# Patient Record
Sex: Male | Born: 1980 | Race: White | Hispanic: No | Marital: Married | Smoking: Former smoker
Health system: Southern US, Community
[De-identification: ages and names within clinical notes are randomized; demographics above are authoritative.]

## PROBLEM LIST (undated history)

## (undated) DIAGNOSIS — F329 Major depressive disorder, single episode, unspecified: Secondary | ICD-10-CM

## (undated) DIAGNOSIS — J45909 Unspecified asthma, uncomplicated: Secondary | ICD-10-CM

## (undated) DIAGNOSIS — Z889 Allergy status to unspecified drugs, medicaments and biological substances status: Secondary | ICD-10-CM

## (undated) DIAGNOSIS — F32A Depression, unspecified: Secondary | ICD-10-CM

## (undated) DIAGNOSIS — T7840XA Allergy, unspecified, initial encounter: Secondary | ICD-10-CM

## (undated) HISTORY — DX: Allergy, unspecified, initial encounter: T78.40XA

## (undated) HISTORY — DX: Unspecified asthma, uncomplicated: J45.909

## (undated) HISTORY — PX: OTHER SURGICAL HISTORY: SHX169

## (undated) HISTORY — DX: Depression, unspecified: F32.A

## (undated) HISTORY — DX: Major depressive disorder, single episode, unspecified: F32.9

---

## 2005-07-30 ENCOUNTER — Ambulatory Visit: Payer: Self-pay | Admitting: Internal Medicine

## 2014-07-23 ENCOUNTER — Other Ambulatory Visit: Payer: Self-pay | Admitting: Internal Medicine

## 2014-07-23 DIAGNOSIS — R1310 Dysphagia, unspecified: Secondary | ICD-10-CM

## 2014-07-27 ENCOUNTER — Ambulatory Visit
Admission: RE | Admit: 2014-07-27 | Discharge: 2014-07-27 | Disposition: A | Discharge status: Home / Self Care | Payer: 59 | Source: Ambulatory Visit | Attending: Internal Medicine | Admitting: Internal Medicine

## 2014-07-27 DIAGNOSIS — R1310 Dysphagia, unspecified: Secondary | ICD-10-CM

## 2014-12-12 ENCOUNTER — Other Ambulatory Visit: Payer: Self-pay | Admitting: Gastroenterology

## 2014-12-25 ENCOUNTER — Other Ambulatory Visit: Payer: Self-pay | Admitting: Gastroenterology

## 2014-12-27 ENCOUNTER — Encounter (HOSPITAL_COMMUNITY): Payer: Self-pay | Admitting: *Deleted

## 2014-12-29 NOTE — Anesthesia Preprocedure Evaluation (Addendum)
Anesthesia Evaluation  Patient identified by MRN, date of birth, ID band Patient awake    Reviewed: Allergy & Precautions, NPO status , Patient's Chart, lab work & pertinent test results, reviewed documented beta blocker date and time   Airway Mallampati: II   Neck ROM: Full    Dental  (+) Dental Advisory Given, Teeth Intact   Pulmonary former smoker (quit 07/2014),  breath sounds clear to auscultation        Cardiovascular negative cardio ROS  Rhythm:Regular     Neuro/Psych    GI/Hepatic Neg liver ROS, Stricture esophageal   Endo/Other  negative endocrine ROS  Renal/GU negative Renal ROS     Musculoskeletal   Abdominal (+)  Abdomen: soft.    Peds  Hematology negative hematology ROS (+)   Anesthesia Other Findings   Reproductive/Obstetrics                            Anesthesia Physical Anesthesia Plan  ASA: II  Anesthesia Plan: MAC   Post-op Pain Management:    Induction: Intravenous  Airway Management Planned: Nasal Cannula  Additional Equipment:   Intra-op Plan:   Post-operative Plan:   Informed Consent: I have reviewed the patients History and Physical, chart, labs and discussed the procedure including the risks, benefits and alternatives for the proposed anesthesia with the patient or authorized representative who has indicated his/her understanding and acceptance.     Plan Discussed with:   Anesthesia Plan Comments:         Anesthesia Quick Evaluation

## 2015-01-01 ENCOUNTER — Encounter (HOSPITAL_COMMUNITY)
Admission: RE | Disposition: A | Discharge status: Home / Self Care | Payer: Self-pay | Source: Ambulatory Visit | Attending: Gastroenterology

## 2015-01-01 ENCOUNTER — Ambulatory Visit (HOSPITAL_COMMUNITY): Payer: 59 | Admitting: Anesthesiology

## 2015-01-01 ENCOUNTER — Ambulatory Visit (HOSPITAL_COMMUNITY)
Admission: RE | Admit: 2015-01-01 | Discharge: 2015-01-01 | Disposition: A | Discharge status: Home / Self Care | Payer: 59 | Source: Ambulatory Visit | Attending: Gastroenterology | Admitting: Gastroenterology

## 2015-01-01 ENCOUNTER — Encounter (HOSPITAL_COMMUNITY): Payer: Self-pay | Admitting: Gastroenterology

## 2015-01-01 DIAGNOSIS — R1310 Dysphagia, unspecified: Secondary | ICD-10-CM | POA: Insufficient documentation

## 2015-01-01 DIAGNOSIS — R12 Heartburn: Secondary | ICD-10-CM | POA: Diagnosis present

## 2015-01-01 DIAGNOSIS — Z87891 Personal history of nicotine dependence: Secondary | ICD-10-CM | POA: Diagnosis not present

## 2015-01-01 HISTORY — PX: BALLOON DILATION: SHX5330

## 2015-01-01 HISTORY — DX: Allergy status to unspecified drugs, medicaments and biological substances: Z88.9

## 2015-01-01 HISTORY — PX: ESOPHAGOGASTRODUODENOSCOPY (EGD) WITH PROPOFOL: SHX5813

## 2015-01-01 SURGERY — ESOPHAGOGASTRODUODENOSCOPY (EGD) WITH PROPOFOL
Anesthesia: Monitor Anesthesia Care

## 2015-01-01 MED ORDER — PROPOFOL 10 MG/ML IV BOLUS
INTRAVENOUS | Status: AC
  Filled 2015-01-01: qty 20

## 2015-01-01 MED ORDER — GLYCOPYRROLATE 0.2 MG/ML IJ SOLN
INTRAMUSCULAR | Status: DC | PRN
  Administered 2015-01-01: 0.2 mg via INTRAVENOUS

## 2015-01-01 MED ORDER — MEPERIDINE HCL 100 MG/ML IJ SOLN: 6.2500 mg | INTRAMUSCULAR | Status: DC | PRN

## 2015-01-01 MED ORDER — SODIUM CHLORIDE 0.9 % IV SOLN: INTRAVENOUS | Status: DC

## 2015-01-01 MED ORDER — PROPOFOL 10 MG/ML IV BOLUS
INTRAVENOUS | Status: DC | PRN
  Administered 2015-01-01 (×2): 50 mg via INTRAVENOUS
  Administered 2015-01-01: 100 mg via INTRAVENOUS

## 2015-01-01 MED ORDER — MIDAZOLAM HCL 10 MG/2ML IJ SOLN
INTRAMUSCULAR | Status: AC
  Filled 2015-01-01: qty 2

## 2015-01-01 MED ORDER — LIDOCAINE HCL (PF) 2 % IJ SOLN
INTRAMUSCULAR | Status: DC | PRN
  Administered 2015-01-01: 20 mg via INTRADERMAL

## 2015-01-01 MED ORDER — FENTANYL CITRATE (PF) 100 MCG/2ML IJ SOLN
INTRAMUSCULAR | Status: AC
  Filled 2015-01-01: qty 2

## 2015-01-01 MED ORDER — LACTATED RINGERS IV SOLN
INTRAVENOUS | Status: DC
  Administered 2015-01-01: 1000 mL via INTRAVENOUS

## 2015-01-01 MED ORDER — PROMETHAZINE HCL 25 MG/ML IJ SOLN: 6.2500 mg | INTRAMUSCULAR | Status: DC | PRN

## 2015-01-01 MED ORDER — LIDOCAINE HCL (CARDIAC) 20 MG/ML IV SOLN
INTRAVENOUS | Status: AC
  Filled 2015-01-01: qty 5

## 2015-01-01 SURGICAL SUPPLY — 14 items

## 2015-01-01 NOTE — Op Note (Signed)
Problem: Chronic heartburn with intermittent solid food esophageal dysphagia. 07/27/2014 barium esophagram showed narrowing of the distal esophagus.  Endoscopist: Danise EdgeMartin Johnson  Premedication: Propofol administered by anesthesia  Procedure: Diagnostic esophagogastroduodenoscopy with random esophageal biopsies to rule out eosinophilic esophagitis and esophageal balloon dilation to 15 mm at the esophagogastric junction  The patient was placed in the left lateral decubitus position. The Pentax gastroscope was passed through the posterior hypopharynx into the proximal esophagus without difficulty. The hypopharynx and larynx appeared normal. I did not visualize the vocal cords.  Esophagoscopy: The proximal, mid, and lower segments of the esophageal mucosa appeared completely normal. The squamocolumnar junction was noted at 40 cm from the incisor teeth. There was no endoscopic evidence for the presence of erosive esophagitis, Barrett's esophagus, eosinophilic esophagitis, or esophageal stricture formation. I easily traversed the esophagogastric junction and entered the proximal stomach without resistance but did stimulate a small amount of bleeding due to endoscope trauma.  Gastroscopy: Retroflex view of the gastric cardia and fundus was normal. The gastric body, antrum, and pylorus appeared normal.  Duodenoscopy: The duodenal bulb and descending duodenum appeared normal  Esophageal balloon dilation. With the esophageal balloon located across the esophagogastric junction, the balloon was inflated to 15 mm without apparent esophageal stricture dilation.  Esophageal biopsies: Random esophageal biopsies were performed along the length of the esophagus to look for signs eosinophilic esophagitis  Assessment: Normal esophagogastroduodenoscopy without signs of erosive esophagitis, Barrett's esophagus, esophageal stricture formation, or eosinophilic esophagitis. Random esophageal biopsies were performed to  look for signs of eosinophilic esophagitis. I cannot rule out an esophageal motility disorder resulting in a poorly relaxing lower esophageal sphincter muscle.

## 2015-01-01 NOTE — H&P (Signed)
  Problem: Esophageal dysphagia. 07/27/2014 barium esophagram showed no narrowing of the distal esophagus.  History: The patient is a 34 year old male born [birth year]. He has chronic heartburn and chronic, intermittent solid food dysphagia without odynophagia. If he takes proton pump inhibitor therapy, his heartburn resolves.  He underwent a barium esophagram which showed normal esophageal motility but narrowing at the esophagogastric junction. The barium tablet did not traverse the distal esophageal narrowing and did not enter the stomach.  The patient is scheduled to undergo diagnostic esophagogastroduodenoscopy with possible esophageal stricture dilation.  Medication allergies: Sulfa  Past medical history: Chronic heartburn. Left wrist fracture surgery.  Exam: The patient is alert and lying comfortably on the endoscopy stretcher. Abdomen is soft and nontender to palpation. Lungs are clear to auscultation. Cardiac exam reveals a regular rhythm.  Plan: Proceed with diagnostic esophagogastroduodenoscopy with possible esophageal stricture dilation

## 2015-01-01 NOTE — Transfer of Care (Signed)
Immediate Anesthesia Transfer of Care Note  Patient: Paul Cohen  Procedure(s) Performed: Procedure(s): ESOPHAGOGASTRODUODENOSCOPY (EGD) WITH PROPOFOL (N/A) BALLOON DILATION (N/A)  Patient Location: PACU  Anesthesia Type:MAC  Level of Consciousness:  sedated, patient cooperative and responds to stimulation  Airway & Oxygen Therapy:Patient Spontanous Breathing and Patient connected to face mask oxgen  Post-op Assessment:  Report given to PACU RN and Post -op Vital signs reviewed and stable  Post vital signs:  Reviewed and stable  Last Vitals:  Filed Vitals:   01/01/15 0634  BP: 132/88  Pulse: 78  Temp: 36.5 C  Resp: 14    Complications: No apparent anesthesia complications

## 2015-01-01 NOTE — Discharge Instructions (Signed)
Esophagogastroduodenoscopy °Care After °Refer to this sheet in the next few weeks. These instructions provide you with information on caring for yourself after your procedure. Your caregiver may also give you more specific instructions. Your treatment has been planned according to current medical practices, but problems sometimes occur. Call your caregiver if you have any problems or questions after your procedure.  °HOME CARE INSTRUCTIONS °· Do not eat or drink anything until the numbing medicine (local anesthetic) has worn off and your gag reflex has returned. You will know that the local anesthetic has worn off when you can swallow comfortably. °· Do not drive for 12 hours after the procedure or as directed by your caregiver. °· Only take medicines as directed by your caregiver. °SEEK MEDICAL CARE IF:  °· You cannot stop coughing. °· You are not urinating at all or less than usual. °SEEK IMMEDIATE MEDICAL CARE IF: °· You have difficulty swallowing. °· You cannot eat or drink. °· You have worsening throat or chest pain. °· You have dizziness, lightheadedness, or you faint. °· You have nausea or vomiting. °· You have chills. °· You have a fever. °· You have severe abdominal pain. °· You have black, tarry, or bloody stools. °Document Released: 07/13/2012 Document Reviewed: 07/13/2012 °ExitCare® Patient Information ©2015 [employer]. This information is not intended to replace advice given to you by your health care provider. Make sure you discuss any questions you have with your health care provider. ° °

## 2015-01-01 NOTE — Anesthesia Postprocedure Evaluation (Signed)
  Anesthesia Post-op Note  Patient: Paul HammockMichael L Cohen  Procedure(s) Performed: Procedure(s): ESOPHAGOGASTRODUODENOSCOPY (EGD) WITH PROPOFOL (N/A) BALLOON DILATION (N/A)  Patient Location: PACU  Anesthesia Type:MAC  Level of Consciousness: awake and alert   Airway and Oxygen Therapy: Patient Spontanous Breathing and Patient connected to nasal cannula oxygen  Post-op Pain: none  Post-op Assessment: Post-op Vital signs reviewed, Patient's Cardiovascular Status Stable, Respiratory Function Stable, Patent Airway and No signs of Nausea or vomiting  Post-op Vital Signs: Reviewed and stable  Last Vitals:  Filed Vitals:   01/01/15 0634  BP: 132/88  Pulse: 78  Temp: 36.5 C  Resp: 14    Complications: No apparent anesthesia complications

## 2015-01-02 ENCOUNTER — Encounter (HOSPITAL_COMMUNITY): Payer: Self-pay | Admitting: Gastroenterology

## 2017-04-06 ENCOUNTER — Encounter: Payer: Self-pay | Admitting: Nurse Practitioner

## 2017-04-06 ENCOUNTER — Ambulatory Visit (INDEPENDENT_AMBULATORY_CARE_PROVIDER_SITE_OTHER): Payer: Self-pay | Admitting: Nurse Practitioner

## 2017-04-06 VITALS — BP 121/76 | HR 84 | Temp 98.1°F | Ht 68.0 in | Wt 205.8 lb

## 2017-04-06 DIAGNOSIS — F329 Major depressive disorder, single episode, unspecified: Secondary | ICD-10-CM

## 2017-04-06 DIAGNOSIS — F419 Anxiety disorder, unspecified: Secondary | ICD-10-CM | POA: Insufficient documentation

## 2017-04-06 DIAGNOSIS — F32A Depression, unspecified: Secondary | ICD-10-CM | POA: Insufficient documentation

## 2017-04-06 DIAGNOSIS — Z7689 Persons encountering health services in other specified circumstances: Secondary | ICD-10-CM

## 2017-04-06 DIAGNOSIS — R6882 Decreased libido: Secondary | ICD-10-CM

## 2017-04-06 DIAGNOSIS — B354 Tinea corporis: Secondary | ICD-10-CM

## 2017-04-06 MED ORDER — BUSPIRONE HCL 5 MG PO TABS: 5.0000 mg | ORAL_TABLET | Freq: Two times a day (BID) | ORAL | 2 refills | Status: DC

## 2017-04-06 NOTE — Progress Notes (Signed)
I have reviewed this encounter including the documentation in this note and/or discussed this patient with the provider, Wilhelmina Mcardle, AGPCNP-BC. I am certifying that I agree with the content of this note as supervising physician.  Saralyn Pilar, DO [facility] 04/06/2017, 4:53 PM

## 2017-04-06 NOTE — Assessment & Plan Note (Signed)
Currently uncontrolled off medication.  Denies SI/HI and has no plans to carry out if SI/HI arise. Pt previously treated for depression w/ sertraline and experienced side effect of decreased libido that has not returned to normal after weaning off this medication.  Symptoms affected marriage which is now improved.  Now also having anxiety w/ racing thoughts, rumination, and general nervousness in setting of no significant life stressors. GAD7:17 PHQ9: 15  Plan: 1. Start buspirone 5 mg bid.  Discussed small chance for decreased libido w/ any SSRI, SNRI and buspirone. 2. Discussed stress management strategies. 3. Contact clinic in 2-4 weeks if needing an increase before your next appointment. 4. Follow up 4 weeks.

## 2017-04-06 NOTE — Progress Notes (Signed)
Subjective:    Patient ID: Paul Cohen, male    DOB: 05-01-1981, 36 y.o.   MRN: [id number]  Paul Cohen is a 36 y.o. male presenting on 04/06/2017 for Establish Care (depression) and Rash (bilateral rash on the hands x 2 mths, itching, red and inflammed )   HPI Establish Care New Provider Pt last seen by PCP 1 year ago.  Obtain records from [place] [facility] Dr. Marden Noble.   [facility] Approx. 2006 - "buckshot polyps"  - Pt unsure of what this really was.  Has not had any followup and is slightly concerned about letting it go unmonitored.   Depression and Anxiety Was started on sertraline and was working.  Married in November - decreased libido.  Decided to wean off in February.  Libido increased, but not returned.  Exercising 2 days per week walking and other activity at work. - Lately anxiety and worry and "can't shake it when it comes on."  Racing/ruminating thoughts, difficulty relaxing - life isn't stressful or hard - Has pan-cycle sleep disturbance - not predictable. - some fatigue, some overeating, feels bad/down about self frequently.  Occasional SI w/o plan to carry this out.  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  Uses lotions regularly.   - Notes new rash w/ cracked, scaly skin, also w/ redness, itching on left hand.  Started about 1 week ago.  Has not used any other OTC products.  GAD 7 : Generalized Anxiety Score 04/06/2017  Nervous, Anxious, on Edge 3  Control/stop worrying 3  Worry too much - different things 3  Trouble relaxing 2  Restless 2  Easily annoyed or irritable 2  Afraid - awful might happen 2  Total GAD 7 Score 17  Anxiety Difficulty Very difficult    Depression screen PHQ 2/9 04/06/2017  Decreased Interest 2  Down, Depressed, Hopeless 2  PHQ - 2 Score 4  Altered sleeping 2  Tired, decreased energy 1  Change in appetite 1  Feeling bad or failure about yourself  3    Trouble concentrating 2  Moving slowly or fidgety/restless 1  Suicidal thoughts 1  PHQ-9 Score 15  Difficult doing work/chores Very difficult   Past Medical History:  Diagnosis Date  . Allergy   . Asthma    childhood severe.  Uses 20-30 puffs per year now.  . Depression   . History of seasonal allergies    "Springtime"   Past Surgical History:  Procedure Laterality Date  . BALLOON DILATION N/A 01/01/2015   Procedure: BALLOON DILATION;  Surgeon: Charolett Bumpers, MD;  Location: [facility];  Service: Endoscopy;  Laterality: N/A;  . ESOPHAGOGASTRODUODENOSCOPY (EGD) WITH PROPOFOL N/A 01/01/2015   Procedure: ESOPHAGOGASTRODUODENOSCOPY (EGD) WITH PROPOFOL;  Surgeon: Charolett Bumpers, MD;  Location: [facility];  Service: Endoscopy;  Laterality: N/A; Biopsy negative, esophageal stretch  . left wrist surgery     Social History   Social History  . Marital status: Married    Spouse name: N/A  . Number of children: N/A  . Years of education: N/A   Occupational History  . Not on file.   Social History Main Topics  . Smoking status: Former Smoker    Types: Cigarettes    Quit date: 07/28/2014  . Smokeless tobacco: Former Neurosurgeon    Types: Snuff     Comment: off/ on 10 yrs, Stopped smokeless in January  . Alcohol use Yes  Comment: weekends  . Drug use: No  . Sexual activity: Not on file   Other Topics Concern  . Not on file   Social History Narrative  . No narrative on file   Family History  Problem Relation Age of Onset  . Healthy Mother   . Colon polyps Mother 7       all negative  . Healthy Father   . Healthy Sister   . Bladder Cancer Maternal Grandmother   . Alcohol abuse Maternal Grandfather   . COPD Paternal Grandmother   . Heart attack Paternal Grandfather   . Healthy Sister   . Colon cancer Neg Hx    Current Outpatient Prescriptions on File Prior to Visit  Medication Sig  . albuterol (PROVENTIL HFA;VENTOLIN HFA) 108 (90 BASE) MCG/ACT inhaler Inhale 1  puff into the lungs every 6 (six) hours as needed for wheezing or shortness of breath.  Marland Kitchen ibuprofen (ADVIL,MOTRIN) 200 MG tablet Take 400 mg by mouth every 6 (six) hours as needed for mild pain.   No current facility-administered medications on file prior to visit.     Review of Systems  Constitutional: Negative.   HENT: Negative.   Eyes: Negative.   Respiratory: Negative.   Cardiovascular: Negative.   Gastrointestinal: Negative.   Endocrine: Negative.   Genitourinary:       Decreased libido  Musculoskeletal: Negative.   Skin: Positive for rash.  Allergic/Immunologic: Negative.   Neurological: Negative.   Hematological: Negative.   Psychiatric/Behavioral: Negative.    Per HPI unless specifically indicated above     Objective:    BP 121/76 (BP Location: Right Arm, Patient Position: Sitting, Cuff Size: Normal)   Pulse 84   Temp 98.1 F (36.7 C) (Oral)   Ht 5\' 8"  (1.727 m)   Wt 205 lb 12.8 oz (93.4 kg)   SpO2 99%   BMI 31.29 kg/m   Wt Readings from Last 3 Encounters:  04/06/17 205 lb 12.8 oz (93.4 kg)  01/01/15 200 lb (90.7 kg)    Physical Exam  General - overweight, well-appearing, NAD HEENT - Normocephalic, atraumatic Heart - RRR, no murmurs heard Lungs - Clear throughout all lobes, no wheezing, crackles, or rhonchi. Normal work of breathing. Extremeties - non-tender, no edema, cap refill < 2 seconds, peripheral pulses intact +2 bilaterally Skin - warm, dry - at MCP joint of third digit left hand erythematous and pruritic papule (single lesion). DIP joint second finger right hand, PIP joint fifth finger right hand - cracked and scaly skin Neuro - awake, alert, oriented x3, normal gait, no tremor Psych - Normal mood and affect, normal behavior   No results found for this or any previous visit.    Assessment & Plan:   Problem List Items Addressed This Visit      Other   Anxiety and depression - Primary    Currently uncontrolled off medication.  Denies SI/HI  and has no plans to carry out if SI/HI arise. Pt previously treated for depression w/ sertraline and experienced side effect of decreased libido that has not returned to normal after weaning off this medication.  Symptoms affected marriage which is now improved.  Now also having anxiety w/ racing thoughts, rumination, and general nervousness in setting of no significant life stressors. GAD7:17 PHQ9: 15  Plan: 1. Start buspirone 5 mg bid.  Discussed small chance for decreased libido w/ any SSRI, SNRI and buspirone. 2. Discussed stress management strategies. 3. Contact clinic in 2-4 weeks if needing an increase  before your next appointment. 4. Follow up 4 weeks.      Relevant Medications   busPIRone (BUSPAR) 5 MG tablet    Other Visit Diagnoses    Tinea corporis     Stable and not worsening.  Complicated by poor skin integrity of hands w/ job.  Plan: 1. Encouraged prevention of non-intact skin.  Use ointments, lotions, when has non-intact skin. 2. Use clotrimazole OTC topical application bid x 7-10 days. 3. For pruritis, may use topical hydrocortisone cream. 4. Follow up as needed.    Decreased libido       Started when taking sertraline.  Incomplete resolution off medication.  Plan: 1. Manage depression and anxiety. 2. Consider urology referral or PDE5i if persists. 3. Follow up 4 weeks w/ depression and anxiety.    Encounter to establish care     Previous PCP was at [facility] in [place].  Past medical, family, and surgical history reviewed.  Records request for PCP and GI made.       Meds ordered this encounter  Medications  . busPIRone (BUSPAR) 5 MG tablet    Sig: Take 1 tablet (5 mg total) by mouth 2 (two) times daily.    Dispense:  60 tablet    Refill:  2    Order Specific Question:   Supervising Provider    Answer:   Smitty Cords [[id number]]      Follow up plan: Return in about 4 weeks (around 05/04/2017) for anxiety and depression AND Annual Physical  October w/ labs 2-3 before .  Wilhelmina Mcardle, DNP, AGPCNP-BC Adult Gerontology Primary Care Nurse Practitioner [facility] 04/06/2017, 2:49 PM

## 2017-04-06 NOTE — Patient Instructions (Addendum)
Paul Cohen, Thank you for coming in to clinic today.  1. For your depression and anxiety: - START buspirone 5 mg twice daily. - Contact clinic in 2-4 weeks if needing an increase before your next appointment.  2. You are due for an annual physical You will be due for FASTING BLOOD WORK (no food or drink after midnight before, only water or coffee without cream/sugar on the morning of) - Please go ahead and schedule a "Lab Only" visit in the morning at the clinic for lab draw in  2-3 days before next Annual Physical.  For Lab Results, once available within 2-3 days of blood draw, you can can log in to MyChart online to view your results and a brief explanation. Also, we can discuss results at next follow-up visit.  3. For your rash: Likely fungal infection. Use OTC Lotrimin or generic clotrimazole topically 2x daily for 7-10 days.   - Hydrocortisone cream for itching  - Res-Q ointment - prevent cracking and easy healing. - Good strong ointment in winter.   Please schedule a follow-up appointment with Wilhelmina Mcardle, AGNP. Return in about 4 weeks (around 05/04/2017) for anxiety and depression AND Annual Physical October w/ labs 2-3 before .  If you have any other questions or concerns, please feel free to call the clinic or send a message through MyChart. You may also schedule an earlier appointment if necessary.  You will receive a survey after today's visit either digitally by e-mail or paper by [employer]. Your experiences and feedback matter to [place].  Please respond so we know how we are doing as we provide care for you.   Wilhelmina Mcardle, DNP, AGNP-BC Adult Gerontology Nurse Practitioner [facility], [facility]    Living With Anxiety After being diagnosed with an anxiety disorder, you may be relieved to know why you have felt or behaved a certain way. It is natural to also feel overwhelmed about the treatment ahead and what it will mean for your life. With care and support,  you can manage this condition and recover from it. How to cope with anxiety Dealing with stress Stress is your body's reaction to life changes and events, both good and bad. Stress can last just a few hours or it can be ongoing. Stress can play a major role in anxiety, so it is important to learn both how to cope with stress and how to think about it differently. Talk with your health care provider or a counselor to learn more about stress reduction. He or she may suggest some stress reduction techniques, such as:  Music therapy. This can include creating or listening to music that you enjoy and that inspires you.  Mindfulness-based meditation. This involves being aware of your normal breaths, rather than trying to control your breathing. It can be done while sitting or walking.  Centering prayer. This is a kind of meditation that involves focusing on a word, phrase, or sacred image that is meaningful to you and that brings you peace.  Deep breathing. To do this, expand your stomach and inhale slowly through your nose. Hold your breath for 3-5 seconds. Then exhale slowly, allowing your stomach muscles to relax.  Self-talk. This is a skill where you identify thought patterns that lead to anxiety reactions and correct those thoughts.  Muscle relaxation. This involves tensing muscles then relaxing them.  Choose a stress reduction technique that fits your lifestyle and personality. Stress reduction techniques take time and practice. Set aside 5-15 minutes  a day to do them. Therapists can offer training in these techniques. The training may be covered by some insurance plans. Other things you can do to manage stress include:  Keeping a stress diary. This can help you learn what triggers your stress and ways to control your response.  Thinking about how you respond to certain situations. You may not be able to control everything, but you can control your reaction.  Making time for activities that  help you relax, and not feeling guilty about spending your time in this way.  Therapy combined with coping and stress-reduction skills provides the best chance for successful treatment. Medicines Medicines can help ease symptoms. Medicines for anxiety include:  Anti-anxiety drugs.  Antidepressants.  Beta-blockers.  Medicines may be used as the main treatment for anxiety disorder, along with therapy, or if other treatments are not working. Medicines should be prescribed by a health care provider. Relationships Relationships can play a big part in helping you recover. Try to spend more time connecting with trusted friends and family members. Consider going to couples counseling, taking family education classes, or going to family therapy. Therapy can help you and others better understand the condition. How to recognize changes in your condition Everyone has a different response to treatment for anxiety. Recovery from anxiety happens when symptoms decrease and stop interfering with your daily activities at home or work. This may mean that you will start to:  Have better concentration and focus.  Sleep better.  Be less irritable.  Have more energy.  Have improved memory.  It is important to recognize when your condition is getting worse. Contact your health care provider if your symptoms interfere with home or work and you do not feel like your condition is improving. Where to find help and support: You can get help and support from these sources:  Self-help groups.  Online and [employer].  A trusted spiritual leader.  Couples counseling.  Family education classes.  Family therapy.  Follow these instructions at home:  Eat a healthy diet that includes plenty of vegetables, fruits, whole grains, low-fat dairy products, and lean protein. Do not eat a lot of foods that are high in solid fats, added sugars, or salt.  Exercise. Most adults should do the  following: ? Exercise for at least 150 minutes each week. The exercise should increase your heart rate and make you sweat (moderate-intensity exercise). ? Strengthening exercises at least twice a week.  Cut down on caffeine, tobacco, alcohol, and other potentially harmful substances.  Get the right amount and quality of sleep. Most adults need 7-9 hours of sleep each night.  Make choices that simplify your life.  Take over-the-counter and prescription medicines only as told by your health care provider.  Avoid caffeine, alcohol, and certain over-the-counter cold medicines. These may make you feel worse. Ask your pharmacist which medicines to avoid.  Keep all follow-up visits as told by your health care provider. This is important. Questions to ask your health care provider  Would I benefit from therapy?  How often should I follow up with a health care provider?  How long do I need to take medicine?  Are there any long-term side effects of my medicine?  Are there any alternatives to taking medicine? Contact a health care provider if:  You have a hard time staying focused or finishing daily tasks.  You spend many hours a day feeling worried about everyday life.  You become exhausted by worry.  You start  to have headaches, feel tense, or have nausea.  You urinate more than normal.  You have diarrhea. Get help right away if:  You have a racing heart and shortness of breath.  You have thoughts of hurting yourself or others. If you ever feel like you may hurt yourself or others, or have thoughts about taking your own life, get help right away. You can go to your nearest emergency department or call:  Your local emergency services (911 in the [place]  A suicide crisis helpline, such as the National Suicide Prevention Lifeline at 210-436-1334. This is open 24-hours a day.  Summary  Taking steps to deal with stress can help calm you.  Medicines cannot cure anxiety  disorders, but they can help ease symptoms.  Family, friends, and partners can play a big part in helping you recover from an anxiety disorder. This information is not intended to replace advice given to you by your health care provider. Make sure you discuss any questions you have with your health care provider. Document Released: 07/21/2016 Document Revised: 07/21/2016 Document Reviewed: 07/21/2016 Elsevier Interactive Patient Education  [employer].

## 2017-04-19 ENCOUNTER — Encounter: Payer: Self-pay | Admitting: Nurse Practitioner

## 2017-04-19 DIAGNOSIS — F329 Major depressive disorder, single episode, unspecified: Secondary | ICD-10-CM

## 2017-04-19 DIAGNOSIS — F32A Depression, unspecified: Secondary | ICD-10-CM

## 2017-04-19 DIAGNOSIS — F419 Anxiety disorder, unspecified: Principal | ICD-10-CM

## 2017-04-21 MED ORDER — BUSPIRONE HCL 7.5 MG PO TABS: 7.5000 mg | ORAL_TABLET | Freq: Two times a day (BID) | ORAL | 2 refills | Status: DC

## 2017-05-04 ENCOUNTER — Encounter: Payer: Self-pay | Admitting: Nurse Practitioner

## 2017-05-04 ENCOUNTER — Ambulatory Visit (INDEPENDENT_AMBULATORY_CARE_PROVIDER_SITE_OTHER): Payer: Self-pay | Admitting: Nurse Practitioner

## 2017-05-04 VITALS — BP 121/66 | HR 76 | Temp 97.7°F | Ht 68.0 in | Wt 204.6 lb

## 2017-05-04 DIAGNOSIS — L309 Dermatitis, unspecified: Secondary | ICD-10-CM

## 2017-05-04 DIAGNOSIS — F419 Anxiety disorder, unspecified: Secondary | ICD-10-CM

## 2017-05-04 DIAGNOSIS — F329 Major depressive disorder, single episode, unspecified: Secondary | ICD-10-CM

## 2017-05-04 DIAGNOSIS — F32A Depression, unspecified: Secondary | ICD-10-CM

## 2017-05-04 MED ORDER — BUPROPION HCL ER (SR) 100 MG PO TB12: 100.0000 mg | ORAL_TABLET | Freq: Two times a day (BID) | ORAL | 1 refills | Status: DC

## 2017-05-04 MED ORDER — BUSPIRONE HCL 7.5 MG PO TABS: 7.5000 mg | ORAL_TABLET | Freq: Two times a day (BID) | ORAL | 2 refills | Status: DC

## 2017-05-04 MED ORDER — TRIAMCINOLONE ACETONIDE 0.025 % EX LOTN: 1.0000 "application " | TOPICAL_LOTION | Freq: Two times a day (BID) | CUTANEOUS | 2 refills | Status: DC

## 2017-05-04 NOTE — Progress Notes (Signed)
Subjective:    Patient ID: Paul Cohen, male    DOB: 18-Oct-1980, 36 y.o.   MRN: [id number]  Paul Cohen is a 36 y.o. male presenting on 05/04/2017 for Anxiety (depression)   HPI  Anxiety and Depression Libido improving.  Anxiety improved.  Depression still predominant. - Sertraline - significant libido decrease. - Improving sleep quality w/ onset of and maintenance of sleep.   - Appetite swings (over and under eating). - Still exercising only 2 days per week. 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  - Has been using topical antifungal cream w/o relief.  Also keeps regular ointments on hands (lotion, vaseline).   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   Hemorrhoid/Tear After BM, in water had a couple drops blood. Lasted about 2 days 2 weeks ago.  Then repeat yesterday.  Bright red blood only occurs after BM.  Has not noticed any blood on or in his stool.   Depression screen [facility] 2/9 05/04/2017 04/06/2017  Decreased Interest 2 2  Down, Depressed, Hopeless 2 2  PHQ - 2 Score 4 4  Altered sleeping 1 2  Tired, decreased energy 1 1  Change in appetite 1 1  Feeling bad or failure about yourself  2 3  Trouble concentrating 1 2  Moving slowly or fidgety/restless 1 1  Suicidal thoughts 1 1  PHQ-9 Score 12 15  Difficult doing work/chores Somewhat difficult Very difficult   GAD 7 : Generalized Anxiety Score 04/06/2017  Nervous, Anxious, on Edge 3  Control/stop worrying 3  Worry too much - different things 3  Trouble relaxing 2  Restless 2  Easily annoyed or irritable 2  Afraid - awful might happen 2  Total GAD 7 Score 17  Anxiety Difficulty Very difficult      Social History  Substance Use Topics  . Smoking status: Former Smoker    Types: Cigarettes    Quit date: 07/28/2014  .  Smokeless tobacco: Former Neurosurgeon    Types: Snuff     Comment: off/ on 10 yrs, Stopped smokeless in January  . Alcohol use Yes     Comment: weekends    Review of Systems Per HPI unless specifically indicated above     Objective:    BP 121/66 (BP Location: Right Arm, Patient Position: Sitting, Cuff Size: Normal)   Pulse 76   Temp 97.7 F (36.5 C) (Oral)   Ht  (1.727 m)   Wt 204 lb 9.6 oz (92.8 kg)   BMI 31.11 kg/m   Wt Readings from Last 3 Encounters:  05/04/17 204 lb 9.6 oz (92.8 kg)  04/06/17 205 lb 12.8 oz (93.4 kg)  01/01/15 200 lb (90.7 kg)    Physical Exam  General - overweight, well-appearing, NAD HEENT - Normocephalic, atraumatic Heart - RRR, no murmurs heard Lungs - Clear throughout all lobes, no wheezing, crackles, or rhonchi. Normal work of breathing. GU - pt deferred despite complaint of bleeding and possible hemorrhoid/rectal tear Extremeties - non-tender, no edema, cap refill < 2 seconds, peripheral pulses intact +2 bilaterally Skin - warm, dry, erythematous plaques w/ flakiness over MCP joints, and at several DIP/PIP joints bilateral hands. Neuro - awake, alert, oriented x3, normal gait Psych - Normal mood and affect, normal behavior    No results  found for this or any previous visit.    Assessment & Plan:   Problem List Items Addressed This Visit      Musculoskeletal and Integument   Eczema of hand    Mild, but persistent eczema lesions on bilateral hands.  Pt w/ occupational exposures to chemicals, metal dusts. Chronic in nature and not relieved w/ OTC ointments/lotions.  Plan: 1. Start treatment w/ triamcinolone ointment.  Consider dermatology referral if needed. 2. Follow up at next appointment in about 3 monts.      Relevant Medications   Triamcinolone Acetonide 0.025 % LOTN     Other   Anxiety and depression - Primary    Currently improving on medications.  Pt only taking buspirone 7.5 bid.  Denies SI/HI and has no plans to carry out if  SI/HI arise.  Improving libido and having less rumination.  Still having persistent appetite swings. GAD7:8 today down from 17 PHQ9: 12 today down from 15  Plan: 1. Continue buspirone 7.5 mg bid.   2. START bupropion SR 100 mg bid.  Cautioned pt about similar drug names. 3. Follow up 3 months and prn 4-6 weeks      Relevant Medications   buPROPion (WELLBUTRIN SR) 100 MG 12 hr tablet   busPIRone (BUSPAR) 7.5 MG tablet      Meds ordered this encounter  Medications  . buPROPion (WELLBUTRIN SR) 100 MG 12 hr tablet    Sig: Take 1 tablet (100 mg total) by mouth 2 (two) times daily.    Dispense:  60 tablet    Refill:  1  . Triamcinolone Acetonide 0.025 % LOTN    Sig: Apply 1 application topically 2 (two) times daily.    Dispense:  1 Bottle    Refill:  2  . busPIRone (BUSPAR) 7.5 MG tablet    Sig: Take 1 tablet (7.5 mg total) by mouth 2 (two) times daily.    Dispense:  60 tablet    Refill:  2      Follow up plan: Return in about 3 months (around 08/03/2017) for anxiety and depression OR in 4-6 weeks if needed .  Wilhelmina Mcardle, DNP, AGPCNP-BC Adult Gerontology Primary Care Nurse Practitioner [facility] 05/08/2017, 9:13 PM

## 2017-05-04 NOTE — Patient Instructions (Addendum)
Cesario, Thank you for coming in to clinic today.  1. Anxiety and Depression: - CONTINUE buspirone at 7.5 mg tablet twice daily - START bupropion SR 100 mg tablet (Wellbutrin SR) Take one tablet once daily for 3 days. Then, increase to 1 tablet twice daily and continue at this dose. Caution names of these medications. They are very similar.   2. Eczema - hands: - Apply triamcinolone lotion twice daily for up to 6 weeks. Then stop for 2 weeks and resume. - If not improved, I will place a referral for dermatology.  3. For your hemorrhoid: - Preparation H ointment when flares - Tucks (witch hazel)pads - apply for 5-10 minutes then remove. Both will help reduce the inflammation.   Please schedule a follow-up appointment with Wilhelmina Mcardle, AGNP. Return in about 3 months (around 08/03/2017) for anxiety and depression OR in 4-6 weeks if needed .  If you have any other questions or concerns, please feel free to call the clinic or send a message through MyChart. You may also schedule an earlier appointment if necessary.  You will receive a survey after today's visit either digitally by e-mail or paper by [employer]. Your experiences and feedback matter to [place].  Please respond so we know how we are doing as we provide care for you.   Wilhelmina Mcardle, DNP, AGNP-BC Adult Gerontology Nurse Practitioner [facility], [facility]   Hand Dermatitis Hand dermatitis is a skin condition that causes small, itchy, raised dots or fluid-filled blisters to form over the palms of the hands. This condition may also be called hand eczema. What are the causes? The cause of this condition is not known. What increases the risk? This condition is more likely to develop in people who have a history of allergies, such as:  Hay fever.  Allergic asthma.  An allergy to latex.  Chemical exposure, injuries, and environmental irritants can make hand dermatitis worse. Washing your hands too often can  remove natural oils, which can dry out the skin and contribute to outbreaks of this condition. What are the signs or symptoms? The most common symptom of this condition is intense itchiness. Cracks or grooves (fissures) on the fingers can also develop. Affected areas can be painful, especially areas where large blisters have formed. How is this diagnosed? This condition is diagnosed with a medical history and physical exam. How is this treated? This condition is treated with medicines, including:  Steroid creams and ointments.  Oral steroid medicines.  Antibiotic medicines. These are prescribed if you have an infection.  Antihistamine medicines. These help to reduce itchiness.  Follow these instructions at home:  Take or apply over-the-counter and prescription medicines only as told by your health care provider.  If you were prescribed an antibiotic medicine, use it as told by your health care provider. Do not stop using the antibiotic even if you start to feel better.  Avoid washing your hands more often than necessary.  Avoid using harsh chemicals on your hands.  Wear protective gloves when you handle products that can irritate your skin.  Keep all follow-up visits as told by your health care provider. This is important. Contact a health care provider if:  Your rash does not improve during the first week of treatment.  Your rash is red or tender.  Your rash has pus coming from it.  Your rash spreads. This information is not intended to replace advice given to you by your health care provider. Make sure you discuss  any questions you have with your health care provider. Document Released: 07/27/2005 Document Revised: 01/02/2016 Document Reviewed: 02/08/2015 Elsevier Interactive Patient Education  [employer].

## 2017-05-08 NOTE — Assessment & Plan Note (Addendum)
Mild, but persistent eczema lesions on bilateral hands.  Pt w/ occupational exposures to chemicals, metal dusts. Chronic in nature and not relieved w/ OTC ointments/lotions.  Plan: 1. Start treatment w/ triamcinolone ointment.  Consider dermatology referral if needed. 2. Follow up at next appointment in about 3 monts.

## 2017-05-08 NOTE — Assessment & Plan Note (Addendum)
Currently improving on medications.  Pt only taking buspirone 7.5 bid.  Denies SI/HI and has no plans to carry out if SI/HI arise.  Improving libido and having less rumination.  Still having persistent appetite swings. GAD7:8 today down from 17 PHQ9: 12 today down from 15  Plan: 1. Continue buspirone 7.5 mg bid.   2. START bupropion SR 100 mg bid.  Cautioned pt about similar drug names. 3. Follow up 3 months and prn 4-6 weeks

## 2017-05-13 ENCOUNTER — Other Ambulatory Visit: Payer: Self-pay

## 2017-05-18 ENCOUNTER — Encounter: Payer: Self-pay | Admitting: Nurse Practitioner

## 2017-06-05 ENCOUNTER — Encounter: Payer: Self-pay | Admitting: Nurse Practitioner

## 2017-06-05 DIAGNOSIS — F329 Major depressive disorder, single episode, unspecified: Secondary | ICD-10-CM

## 2017-06-05 DIAGNOSIS — F419 Anxiety disorder, unspecified: Principal | ICD-10-CM

## 2017-06-05 DIAGNOSIS — F32A Depression, unspecified: Secondary | ICD-10-CM

## 2017-06-07 MED ORDER — BUSPIRONE HCL 5 MG PO TABS: 7.5000 mg | ORAL_TABLET | Freq: Two times a day (BID) | ORAL | 5 refills | Status: DC

## 2017-06-30 ENCOUNTER — Other Ambulatory Visit: Payer: Self-pay | Admitting: Nurse Practitioner

## 2017-06-30 DIAGNOSIS — F419 Anxiety disorder, unspecified: Principal | ICD-10-CM

## 2017-06-30 DIAGNOSIS — F329 Major depressive disorder, single episode, unspecified: Secondary | ICD-10-CM

## 2017-07-14 ENCOUNTER — Other Ambulatory Visit: Payer: Self-pay | Admitting: Nurse Practitioner

## 2017-07-14 ENCOUNTER — Other Ambulatory Visit: Payer: Self-pay

## 2017-07-14 DIAGNOSIS — Z0000 Encounter for general adult medical examination without abnormal findings: Secondary | ICD-10-CM

## 2017-07-15 LAB — CBC WITH DIFFERENTIAL/PLATELET
Basophils Absolute: 63 cells/uL (ref 0–200)
Basophils Relative: 0.9 %
Eosinophils Absolute: 252 cells/uL (ref 15–500)
Eosinophils Relative: 3.6 %
HCT: 43.7 % (ref 38.5–50.0)
Hemoglobin: 15 g/dL (ref 13.2–17.1)
Lymphs Abs: 1323 cells/uL (ref 850–3900)
MCH: 28.1 pg (ref 27.0–33.0)
MCHC: 34.3 g/dL (ref 32.0–36.0)
MCV: 82 fL (ref 80.0–100.0)
MPV: 9.8 fL (ref 7.5–12.5)
Monocytes Relative: 7.7 %
Neutro Abs: 4823 cells/uL (ref 1500–7800)
Neutrophils Relative %: 68.9 %
Platelets: 314 10*3/uL (ref 140–400)
RBC: 5.33 10*6/uL (ref 4.20–5.80)
RDW: 13.1 % (ref 11.0–15.0)
Total Lymphocyte: 18.9 %
WBC mixed population: 539 cells/uL (ref 200–950)
WBC: 7 10*3/uL (ref 3.8–10.8)

## 2017-07-15 LAB — COMPREHENSIVE METABOLIC PANEL
AG Ratio: 1.8 (calc) (ref 1.0–2.5)
ALT: 27 U/L (ref 9–46)
AST: 22 U/L (ref 10–40)
Albumin: 4.4 g/dL (ref 3.6–5.1)
Alkaline phosphatase (APISO): 44 U/L (ref 40–115)
BUN: 15 mg/dL (ref 7–25)
CO2: 30 mmol/L (ref 20–32)
Calcium: 9.4 mg/dL (ref 8.6–10.3)
Chloride: 101 mmol/L (ref 98–110)
Creat: 1 mg/dL (ref 0.60–1.35)
Globulin: 2.4 g/dL (calc) (ref 1.9–3.7)
Glucose, Bld: 100 mg/dL — ABNORMAL HIGH (ref 65–99)
Potassium: 4.6 mmol/L (ref 3.5–5.3)
Sodium: 138 mmol/L (ref 135–146)
Total Bilirubin: 0.5 mg/dL (ref 0.2–1.2)
Total Protein: 6.8 g/dL (ref 6.1–8.1)

## 2017-07-15 LAB — LIPID PANEL
Cholesterol: 205 mg/dL — ABNORMAL HIGH (ref <200)
HDL: 38 mg/dL — ABNORMAL LOW (ref >40)
LDL Cholesterol (Calc): 144 mg/dL (calc) — ABNORMAL HIGH
Non-HDL Cholesterol (Calc): 167 mg/dL (calc) — ABNORMAL HIGH (ref <130)
Total CHOL/HDL Ratio: 5.4 (calc) — ABNORMAL HIGH (ref <5.0)
Triglycerides: 110 mg/dL (ref <150)

## 2017-07-15 LAB — TSH: TSH: 1.24 mIU/L (ref 0.40–4.50)

## 2017-07-21 ENCOUNTER — Other Ambulatory Visit: Payer: Self-pay

## 2017-07-21 ENCOUNTER — Encounter: Payer: Self-pay | Admitting: Nurse Practitioner

## 2017-07-21 ENCOUNTER — Ambulatory Visit (INDEPENDENT_AMBULATORY_CARE_PROVIDER_SITE_OTHER): Payer: Self-pay | Admitting: Nurse Practitioner

## 2017-07-21 VITALS — BP 116/68 | HR 88 | Temp 98.4°F | Ht 68.0 in | Wt 208.0 lb

## 2017-07-21 DIAGNOSIS — F32A Depression, unspecified: Secondary | ICD-10-CM

## 2017-07-21 DIAGNOSIS — F329 Major depressive disorder, single episode, unspecified: Secondary | ICD-10-CM

## 2017-07-21 DIAGNOSIS — F419 Anxiety disorder, unspecified: Secondary | ICD-10-CM

## 2017-07-21 DIAGNOSIS — Z0000 Encounter for general adult medical examination without abnormal findings: Secondary | ICD-10-CM

## 2017-07-21 DIAGNOSIS — L309 Dermatitis, unspecified: Secondary | ICD-10-CM

## 2017-07-21 MED ORDER — BUPROPION HCL ER (XL) 150 MG PO TB24: 150.0000 mg | ORAL_TABLET | Freq: Every day | ORAL | 1 refills | Status: DC

## 2017-07-21 MED ORDER — TRIAMCINOLONE ACETONIDE 0.1 % EX CREA: 1.0000 "application " | TOPICAL_CREAM | Freq: Two times a day (BID) | CUTANEOUS | 0 refills | Status: DC

## 2017-07-21 NOTE — Assessment & Plan Note (Signed)
Mild, but persistent eczema lesions on bilateral hands improved and controlled w/ topical triamcinolone lotion 0.25%.  Returns/worsens if not using daily.  Pt w/ occupational exposures to chemicals, metal dusts. Chronic in nature and not relieved w/ OTC ointments/lotions.    Plan: 1. Increase to triamcinolone 0.1% application twice daily. 2. May also augment w/ eucerin lotion. 3. Followup w/ dermatology if needed and here in 1 year.

## 2017-07-21 NOTE — Progress Notes (Signed)
Subjective:    Patient ID: Paul Cohen, male    DOB: February 07, 1981, 36 y.o.   MRN: [id number]  Paul Cohen is a 36 y.o. male presenting on 07/21/2017 for Annual Exam (f/u lab results )  HPI   Annual Physical Exam Patient has been feeling well.  They have no acute concerns today. Sleeps 6-7 hours per night occasionally interrupted, but is fairly easy to go back to sleep.  HEALTH MAINTENANCE: Weight/BMI: stable Physical activity: rarely.  Work walking several miles over the day.  NO significant elevation of HR. Diet: increased sodas over September to early December.   - Fried foods once per week.   - oatmeal breakfast Seatbelt: always  Sunscreen: only w/ prolonged exposure on HIV:  never  Optometry: not regular  Last physical was 2-3 years ago  Dentistry: about every 6 months  VACCINES:  Tetanus: Last Td 2-3 years ago. Influenza: decline  Dep[facility] screen [facility] 2[facility] 07/21/2017 05/04/2017 04/06/2017  Decreased Interest 0 2 2  Down, Depressed, Hopeless 1 2 2   PHQ - 2 Score 1 4 4   Altered sleeping 1 1 2   Tired, decreased energy 1 1 1   Change in appetite 1 1 1   Feeling bad or failure about yourself  1 2 3   Trouble concentrating 1 1 2   Moving slowly or fidgety/restless 0 1 1  Suicidal thoughts 0 1 1  PHQ-9 Score 6 12 15   Difficult doing work/chores Somewhat difficult Somewhat difficult Very difficult    Past Medical History:  Diagnosis Date  . Allergy   . Asthma    childhood severe.  Uses 20-30 puffs per year now.  . Depression   . History of seasonal allergies    "Springtime"   Past Surgical History:  Procedure Laterality Date  . BALLOON DILATION N/A 01/01/2015   Procedure: BALLOON DILATION;  Surgeon: Charolett BumpersMartin K Johnson, MD;  [facility];  Service: Endoscopy;  Laterality: N/A;  . ESOPHAGOGASTRODUODENOSCOPY (EGD) WITH PROPOFOL N/A 01/01/2015   Procedure: ESOPHAGOGASTRODUODENOSCOPY (EGD) WITH PROPOFOL;  Surgeon: Charolett BumpersMartin K Johnson, MD;  Location: [facility];   Service: Endoscopy;  Laterality: N/A; Biopsy negative, esophageal stretch  . left wrist surgery     Social History   Socioeconomic History  . Marital status: Married    Spouse name: Not on file  . Number of children: Not on file  . Years of education: Not on file  . Highest education level: Not on file  Social Needs  . Financial resource strain: Not on file  . Food insecurity - worry: Not on file  . Food insecurity - inability: Not on file  . Transportation needs - medical: Not on file  . Transportation needs - non-medical: Not on file  Occupational History  . Not on file  Tobacco Use  . Smoking status: Former Smoker    Types: Cigarettes    Last attempt to quit: 07/28/2014    Years since quitting: 2.9  . Smokeless tobacco: Former NeurosurgeonUser    Types: Snuff  . Tobacco comment: off/ on 10 yrs, Stopped smokeless in January  Substance and Sexual Activity  . Alcohol use: Yes    Comment: weekends  . Drug use: No  . Sexual activity: Not on file  Other Topics Concern  . Not on file  Social History Narrative  . Not on file   Family History  Problem Relation Age of Onset  . Healthy Mother   . Colon polyps Mother 4850  all negative  . Healthy Father   . Healthy Sister   . Bladder Cancer Maternal Grandmother   . Alcohol abuse Maternal Grandfather   . COPD Paternal Grandmother   . Heart attack Paternal Grandfather   . Healthy Sister   . Colon cancer Neg Hx    Current Outpatient Medications on File Prior to Visit  Medication Sig  . albuterol (PROVENTIL HFA;VENTOLIN HFA) 108 (90 BASE) MCG/ACT inhaler Inhale 1 puff into the lungs every 6 (six) hours as needed for wheezing or shortness of breath.  . busPIRone (BUSPAR) 5 MG tablet Take 1.5 tablets (7.5 mg total) by mouth 2 (two) times daily.  Marland Kitchen. ibuprofen (ADVIL,MOTRIN) 200 MG tablet Take 400 mg by mouth every 6 (six) hours as needed for mild pain.   No current facility-administered medications on file prior to visit.     Review  of Systems Per HPI unless specifically indicated above     Objective:    BP 116/68 (BP Location: Right Arm, Patient Position: Sitting, Cuff Size: Normal)   Pulse 88   Temp 98.4 F (36.9 C) (Oral)   Ht 5\' 8"  (1.727 m)   Wt 208 lb (94.3 kg)   BMI 31.63 kg/m   Wt Readings from Last 3 Encounters:  07/21/17 208 lb (94.3 kg)  05/04/17 204 lb 9.6 oz (92.8 kg)  04/06/17 205 lb 12.8 oz (93.4 kg)    Physical Exam  General - overweight, well-appearing, NAD HEENT - Normocephalic, atraumatic, PERRL, EOMI, patent nares w/o congestion, oropharynx clear, MMM Neck - supple, non-tender, no LAD, no thyromegaly Heart - RRR, no murmurs heard Lungs - Clear throughout all lobes, no wheezing, crackles, or rhonchi. Normal work of breathing. Abdomen - soft, NTND, no masses, no hepatosplenomegaly, active bowel sounds GU - deferred by pt today Extremeties - non-tender, no edema, cap refill < 2 seconds, peripheral pulses intact +2 bilaterally Skin - warm, dry - persistent dry/flaky rash on bilateral hands/fingers c/w eczema Neuro - awake, alert, oriented x3, CN II-X intact, intact muscle strength 5/5 bilaterally, intact distal sensation to light touch, normal coordination, normal gait Psych - Normal mood and affect, normal behavior   Results for orders placed or performed in visit on 07/14/17  Lipid panel  Result Value Ref Range   Cholesterol 205 (H) <200 mg/dL   HDL 38 (L) >16>40 mg/dL   Triglycerides 109110 <604<150 mg/dL   LDL Cholesterol (Calc) 144 (H) mg/dL (calc)   Total CHOL/HDL Ratio 5.4 (H) <5.0 (calc)   Non-HDL Cholesterol (Calc) 167 (H) <130 mg/dL (calc)  Comprehensive metabolic panel  Result Value Ref Range   Glucose, Bld 100 (H) 65 - 99 mg/dL   BUN 15 7 - 25 mg/dL   Creat 5.401.00 9.810.60 - 1.911.35 mg/dL   BUN/Creatinine Ratio NOT APPLICABLE 6 - 22 (calc)   Sodium 138 135 - 146 mmol/L   Potassium 4.6 3.5 - 5.3 mmol/L   Chloride 101 98 - 110 mmol/L   CO2 30 20 - 32 mmol/L   Calcium 9.4 8.6 - 10.3  mg/dL   Total Protein 6.8 6.1 - 8.1 g/dL   Albumin 4.4 3.6 - 5.1 g/dL   Globulin 2.4 1.9 - 3.7 g/dL (calc)   AG Ratio 1.8 1.0 - 2.5 (calc)   Total Bilirubin 0.5 0.2 - 1.2 mg/dL   Alkaline phosphatase (APISO) 44 40 - 115 U/L   AST 22 10 - 40 U/L   ALT 27 9 - 46 U/L  CBC with Differential/Platelet  Result Value  Ref Range   WBC 7.0 3.8 - 10.8 Thousand/uL   RBC 5.33 4.20 - 5.80 Million/uL   Hemoglobin 15.0 13.2 - 17.1 g/dL   HCT 11.9 14.7 - 82.9 %   MCV 82.0 80.0 - 100.0 fL   MCH 28.1 27.0 - 33.0 pg   MCHC 34.3 32.0 - 36.0 g/dL   RDW 56.2 13.0 - 86.5 %   Platelets 314 140 - 400 Thousand/uL   MPV 9.8 7.5 - 12.5 fL   Neutro Abs 4,823 1,500 - 7,800 cells/uL   Lymphs Abs 1,323 850 - 3,900 cells/uL   WBC mixed population 539 200 - 950 cells/uL   Eosinophils Absolute 252 15 - 500 cells/uL   Basophils Absolute 63 0 - 200 cells/uL   Neutrophils Relative % 68.9 %   Total Lymphocyte 18.9 %   Monocytes Relative 7.7 %   Eosinophils Relative 3.6 %   Basophils Relative 0.9 %  TSH  Result Value Ref Range   TSH 1.24 0.40 - 4.50 mIU/L      Assessment & Plan:   Problem List Items Addressed This Visit      Musculoskeletal and Integument   Eczema of hand    Mild, but persistent eczema lesions on bilateral hands improved and controlled w/ topical triamcinolone lotion 0.25%.  Returns/worsens if not using daily.  Pt w/ occupational exposures to chemicals, metal dusts. Chronic in nature and not relieved w/ OTC ointments/lotions.    Plan: 1. Increase to triamcinolone 0.1% application twice daily. 2. May also augment w/ eucerin lotion. 3. Followup w/ dermatology if needed and here in 1 year.      Relevant Medications   triamcinolone cream (KENALOG) 0.1 %     Other   Anxiety and depression    Currently improved and stable on medications.  Pt taking buspirone 7.5 bid and Wellbutrin SR 100 mg twice daily. Pt does report possible side effect of vivid dreams interrupting sleep. Denies SI/HI and  has no plans to carry out if SI/HI arise.  Continues to have improved libido and relationships.  Plan: 1. Continue buspirone 7.5 mg bid.   2. CHANGE to bupropion XL 150 mg once daily for possible reduction of vivid dreams.  Cautioned pt about similar drug names. 3. Follow up 1 year or sooner if changes occur.      Relevant Medications   buPROPion (WELLBUTRIN XL) 150 MG 24 hr tablet    Other Visit Diagnoses    Encounter for annual physical exam    -  Primary Physical exam with no new findings.  Chronic conditions reviewed as above since vivid dreams reviewed w/ sleep and skin assessed in exam.  Well adult with no acute concerns.  Plan: 1. Obtain health maintenance screenings w/ labs prior to visit.  - Results reviewed in clinic today.  Lifestyle management of hyperlipidemia reviewed w/ increasing physical activity and eating low glycemic diet. 2. Return 1 year for annual physical.       Meds ordered this encounter  Medications  . buPROPion (WELLBUTRIN XL) 150 MG 24 hr tablet    Sig: Take 1 tablet (150 mg total) by mouth daily.    Dispense:  90 tablet    Refill:  1    Order Specific Question:   Supervising Provider    Answer:   Smitty Cords [[id number]]  . triamcinolone cream (KENALOG) 0.1 %    Sig: Apply 1 application topically 2 (two) times daily.    Dispense:  30 g  Refill:  0    Order Specific Question:   Supervising Provider    Answer:   Smitty Cords [[id number]]      Follow up plan: Return in about 1 year (around 07/21/2018) for annual physical and depression.  Wilhelmina Mcardle, DNP, AGPCNP-BC Adult Gerontology Primary Care Nurse Practitioner [facility] 07/21/2017, 12:08 PM

## 2017-07-21 NOTE — Patient Instructions (Addendum)
Paul Cohen, Thank you for coming in to clinic today.  1. For your wellbutrin: - Change to Wellbutrin XL 150 mg one tablet once daily.  2. For your skin: - Increase to 0.1% triamcinolone cream for better relief.  Please schedule a follow-up appointment with Paul Cohen, Paul Cohen. Return in about 1 year (around 07/21/2018) for annual physical and depression.  If you have any other questions or concerns, please feel free to call the clinic or send a message through MyChart. You may also schedule an earlier appointment if necessary.  You will receive a survey after today's visit either digitally by e-mail or paper by [employer] mail. Your experiences and feedback matter to us.  Please respond so we know how we are doing as we provide care for you.   Paul McardleLauren Florine Sprenkle, Paul Cohen, Paul Cohen Adult Gerontology Nurse Practitioner [facility]

## 2017-07-21 NOTE — Assessment & Plan Note (Signed)
Currently improved and stable on medications.  Pt taking buspirone 7.5 bid and Wellbutrin SR 100 mg twice daily. Pt does report possible side effect of vivid dreams interrupting sleep. Denies SI/HI and has no plans to carry out if SI/HI arise.  Continues to have improved libido and relationships.  Plan: 1. Continue buspirone 7.5 mg bid.   2. CHANGE to bupropion XL 150 mg once daily for possible reduction of vivid dreams.  Cautioned pt about similar drug names. 3. Follow up 1 year or sooner if changes occur.

## 2018-01-02 ENCOUNTER — Other Ambulatory Visit: Payer: Self-pay

## 2018-01-02 ENCOUNTER — Emergency Department: Payer: No Typology Code available for payment source

## 2018-01-02 ENCOUNTER — Encounter: Payer: Self-pay | Admitting: Emergency Medicine

## 2018-01-02 ENCOUNTER — Emergency Department
Admission: EM | Admit: 2018-01-02 | Discharge: 2018-01-02 | Disposition: A | Discharge status: Home / Self Care | Payer: No Typology Code available for payment source | Attending: Emergency Medicine | Admitting: Emergency Medicine

## 2018-01-02 DIAGNOSIS — R1031 Right lower quadrant pain: Secondary | ICD-10-CM | POA: Insufficient documentation

## 2018-01-02 DIAGNOSIS — Z87891 Personal history of nicotine dependence: Secondary | ICD-10-CM | POA: Insufficient documentation

## 2018-01-02 DIAGNOSIS — Z79899 Other long term (current) drug therapy: Secondary | ICD-10-CM | POA: Insufficient documentation

## 2018-01-02 DIAGNOSIS — J45909 Unspecified asthma, uncomplicated: Secondary | ICD-10-CM | POA: Insufficient documentation

## 2018-01-02 LAB — COMPREHENSIVE METABOLIC PANEL
ALT: 22 U/L (ref 17–63)
ANION GAP: 8 (ref 5–15)
AST: 22 U/L (ref 15–41)
Albumin: 4.2 g/dL (ref 3.5–5.0)
Alkaline Phosphatase: 41 U/L (ref 38–126)
BUN: 18 mg/dL (ref 6–20)
CHLORIDE: 101 mmol/L (ref 101–111)
CO2: 28 mmol/L (ref 22–32)
Calcium: 9.2 mg/dL (ref 8.9–10.3)
Creatinine, Ser: 1.04 mg/dL (ref 0.61–1.24)
GFR calc non Af Amer: >60 mL/min (ref >60)
Glucose, Bld: 96 mg/dL (ref 65–99)
POTASSIUM: 3.9 mmol/L (ref 3.5–5.1)
Sodium: 137 mmol/L (ref 135–145)
TOTAL PROTEIN: 7.7 g/dL (ref 6.5–8.1)
Total Bilirubin: 0.5 mg/dL (ref 0.3–1.2)

## 2018-01-02 LAB — CBC WITH DIFFERENTIAL/PLATELET
Basophils Absolute: 0.1 10*3/uL (ref 0–0.1)
Basophils Relative: 1 %
Eosinophils Absolute: 0.6 10*3/uL (ref 0–0.7)
Eosinophils Relative: 5 %
HCT: 42.9 % (ref 40.0–52.0)
HEMOGLOBIN: 15 g/dL (ref 13.0–18.0)
Lymphocytes Relative: 22 %
Lymphs Abs: 2.6 10*3/uL (ref 1.0–3.6)
MCH: 28.7 pg (ref 26.0–34.0)
MCHC: 35 g/dL (ref 32.0–36.0)
MCV: 81.9 fL (ref 80.0–100.0)
Monocytes Absolute: 1 10*3/uL (ref 0.2–1.0)
Monocytes Relative: 8 %
NEUTROS PCT: 64 %
Neutro Abs: 7.9 10*3/uL — ABNORMAL HIGH (ref 1.4–6.5)
PLATELETS: 311 10*3/uL (ref 150–440)
RBC: 5.24 MIL/uL (ref 4.40–5.90)
RDW: 13.5 % (ref 11.5–14.5)
WBC: 12.2 10*3/uL — AB (ref 3.8–10.6)

## 2018-01-02 LAB — URINALYSIS, COMPLETE (UACMP) WITH MICROSCOPIC
BILIRUBIN URINE: NEGATIVE
Glucose, UA: NEGATIVE mg/dL
HGB URINE DIPSTICK: NEGATIVE
Ketones, ur: NEGATIVE mg/dL
LEUKOCYTES UA: NEGATIVE
Nitrite: NEGATIVE
PH: 7 (ref 5.0–8.0)
Protein, ur: NEGATIVE mg/dL
Specific Gravity, Urine: 1.008 (ref 1.005–1.030)

## 2018-01-02 NOTE — Discharge Instructions (Addendum)
Please return for worse pain fever or nausea  or vomiting. If thepain comes back where the hernia is and will not resolve with lying down or resting please return as hernia could possibly get incarcerated or stuck. That is something that may need surgery. Also appendicitis can happen in the right lower quadrant of the abdomen. That is usually a dull pain that usually is accompanied by low-grade fever and nausea or vomiting. If any of those happen or if the pain comes back and stays please return and we'll reevaluate you.

## 2018-01-02 NOTE — ED Triage Notes (Signed)
Pt c/o lower abdominal pain. Pt states dx with umbilical hernia on 5/16 at [facility]. Pt denies worsening of the hernia at this time. Pt denies N/V/D at this time.

## 2018-01-02 NOTE — ED Notes (Signed)
Patient transported to X-ray 

## 2018-01-02 NOTE — ED Notes (Addendum)
Pt presents with abd pain at the site where he was dx with an umbilical hernia at [facility] May16th - pt denies N/V - denies difficulty with urination but c/o constipation (last BM Friday) - Dr Darnelle Catalan at bedside assessing pt

## 2018-01-02 NOTE — ED Provider Notes (Signed)
[facility] Emergency Department Provider Note   ____________________________________________   First MD Initiated Contact with Patient 01/02/18 1801     (approximate)  I have reviewed the triage vital signs and the nursing notes.   HISTORY  Chief Complaint Abdominal Pain   HPI Paul Cohen is a 37 y.o. male patient reports developing right sided lower abdominal pain today. He has no nausea vomiting or diarrhea with torsion when he stands up better if he sits or lays. It is not tender to palpation percussion. Not in the area of his umbilical hernia that was diagnosed on the 16th. Pain feels sharp and stabbing. Nothing really seems to make it better or worse besides position change.   Past Medical History:  Diagnosis Date  . Allergy   . Asthma    childhood severe.  Uses 20-30 puffs per year now.  . Depression   . History of seasonal allergies    "Springtime"    Patient Active Problem List   Diagnosis Date Noted  . Eczema of hand 05/04/2017  . Anxiety and depression 04/06/2017    Past Surgical History:  Procedure Laterality Date  . BALLOON DILATION N/A 01/01/2015   Procedure: BALLOON DILATION;  Surgeon: Charolett Bumpers, MD;  [facility];  Service: Endoscopy;  Laterality: N/A;  . ESOPHAGOGASTRODUODENOSCOPY (EGD) WITH PROPOFOL N/A 01/01/2015   Procedure: ESOPHAGOGASTRODUODENOSCOPY (EGD) WITH PROPOFOL;  Surgeon: Charolett Bumpers, MD;  Location: [facility];  Service: Endoscopy;  Laterality: N/A; Biopsy negative, esophageal stretch  . left wrist surgery      Prior to Admission medications   Medication Sig Start Date End Date Taking? Authorizing Provider  albuterol (PROVENTIL HFA;VENTOLIN HFA) 108 (90 BASE) MCG/ACT inhaler Inhale 1 puff into the lungs every 6 (six) hours as needed for wheezing or shortness of breath.    [provider]  buPROPion (WELLBUTRIN XL) 150 MG 24 hr tablet Take 1 tablet (150 mg total) by mouth daily.  07/21/17   Galen Manila, NP  busPIRone (BUSPAR) 5 MG tablet Take 1.5 tablets (7.5 mg total) by mouth 2 (two) times daily. 06/07/17   Galen Manila, NP  ibuprofen (ADVIL,MOTRIN) 200 MG tablet Take 400 mg by mouth every 6 (six) hours as needed for mild pain.    [provider]  triamcinolone cream (KENALOG) 0.1 % Apply 1 application topically 2 (two) times daily. 07/21/17   Galen Manila, NP    Allergies Sulfa antibiotics  Family History  Problem Relation Age of Onset  . Healthy Mother   . Colon polyps Mother 72       all negative  . Healthy Father   . Healthy Sister   . Bladder Cancer Maternal Grandmother   . Alcohol abuse Maternal Grandfather   . COPD Paternal Grandmother   . Heart attack Paternal Grandfather   . Healthy Sister   . Colon cancer Neg Hx     Social History Social History   Tobacco Use  . Smoking status: Former Smoker    Types: Cigarettes    Last attempt to quit: 07/28/2014    Years since quitting: 3.4  . Smokeless tobacco: Former Neurosurgeon    Types: Snuff  . Tobacco comment: off/ on 10 yrs, Stopped smokeless in January  Substance Use Topics  . Alcohol use: Yes    Comment: weekends  . Drug use: No    Review of Systems  Constitutional: No fever/chills Eyes: No visual changes. ENT: No sore throat. Cardiovascular: Denies  chest pain. Respiratory: Denies shortness of breath. Gastrointestinal: see history of present illness Genitourinary: Negative for dysuria. Musculoskeletal: Negative for back pain. Skin: Negative for rash. Neurological: Negative for headaches, focal weakness   ____________________________________________   PHYSICAL EXAM:  VITAL SIGNS: ED Triage Vitals  Enc Vitals Group     BP 01/02/18 1755 135/87     Pulse Rate 01/02/18 1755 87     Resp 01/02/18 1755 18     Temp 01/02/18 1755 98.4 F (36.9 C)     Temp Source 01/02/18 1755 Oral     SpO2 01/02/18 1755 99 %     Weight 01/02/18 1755 207 lb (93.9  kg)     Height 01/02/18 1755  (1.702 m)     Head Circumference --      Peak Flow --      Pain Score 01/02/18 1758 5     Pain Loc --      Pain Edu? --      Excl. in GC? --     Constitutional: Alert and oriented. Well appearing and in no acute distress. Eyes: Conjunctivae are normal.  Head: Atraumatic. Nose: No congestion/rhinnorhea. Mouth/Throat: Mucous membranes are moist.  Oropharynx non-erythematous. Neck: No stridor.  Cardiovascular: Normal rate, regular rhythm. Grossly normal heart sounds.  Good peripheral circulation. Respiratory: Normal respiratory effort.  No retractions. Lungs CTAB. Gastrointestinal: Soft and nontender. No distention. No abdominal bruits. No CVA tenderness. Musculoskeletal: No lower extremity tenderness nor edema.  No joint effusions. Neurologic:  Normal speech and language. No gross focal neurologic deficits are appreciated. No gait instability. Skin:  Skin is warm, dry and intact. No rash noted. Psychiatric: Mood and affect are normal. Speech and behavior are normal.  ____________________________________________   LABS (all labs ordered are listed, but only abnormal results are displayed)  Labs Reviewed  CBC WITH DIFFERENTIAL/PLATELET - Abnormal; Notable for the following components:      Result Value   WBC 12.2 (*)    Neutro Abs 7.9 (*)    All other components within normal limits  URINALYSIS, COMPLETE (UACMP) WITH MICROSCOPIC - Abnormal; Notable for the following components:   Color, Urine YELLOW (*)    APPearance CLEAR (*)    Bacteria, UA RARE (*)    All other components within normal limits  COMPREHENSIVE METABOLIC PANEL   ____________________________________________  EKG   ____________________________________________  RADIOLOGY  ED MD interpretation:    Official radiology report(s): Dg Abdomen Acute W/chest  Result Date: 01/02/2018 CLINICAL DATA:  Acute onset of right lower quadrant abdominal pain. EXAM: DG ABDOMEN ACUTE W/  1V CHEST COMPARISON:  CT of the abdomen and pelvis performed 07/30/2005 FINDINGS: The lungs are well-aerated and clear. There is no evidence of focal opacification, pleural effusion or pneumothorax. The cardiomediastinal silhouette is within normal limits. The visualized bowel gas pattern is unremarkable. Scattered stool and air are seen within the colon; there is no evidence of small bowel dilatation to suggest obstruction. No free intra-abdominal air is identified on the provided upright view. No acute osseous abnormalities are seen; the sacroiliac joints are unremarkable in appearance. IMPRESSION: 1. Unremarkable bowel gas pattern; no free intra-abdominal air seen. Moderate amount of stool noted in the colon. 2. No acute cardiopulmonary process seen. Electronically Signed   By: Roanna Raider M.D.   On: 01/02/2018 18:30    ____________________________________________   PROCEDURES  Procedure(s) performed:   Procedures  Critical Care performed:   ____________________________________________   INITIAL IMPRESSION / ASSESSMENT AND PLAN / ED  COURSE  ----------------------------------------- 7:58 PM on 01/02/2018 -----------------------------------------  At this time patient is pain-free palpation percussion standing and jumping and landing on his heels does not cause any pain and pain is not present when he stands up at all I will let him go I will about the possibility of appendicitis or other problems        ____________________________________________   FINAL CLINICAL IMPRESSION(S) / ED DIAGNOSES  Final diagnoses:  Right lower quadrant abdominal pain     ED Discharge Orders    None       Note:  This document was prepared using Dragon voice recognition software and may include unintentional dictation errors.    Arnaldo Natal, MD 01/02/18 2001

## 2018-01-07 ENCOUNTER — Ambulatory Visit: Payer: Self-pay | Admitting: General Surgery

## 2018-01-07 NOTE — H&P (View-Only) (Signed)
PATIENT PROFILE: Renne MuscaMichael Dor is a 37 y.o. male who presents to the Clinic for consultation at the request of Dr. Wallene Huharew for evaluation of umbilical hernia.  PCP:  Galen ManilaKennedy, Lauren Renee, NP  HISTORY OF PRESENT ILLNESS: Mr. Elesa MassedWard reports feeling pain on the peri umbilical area on may 16th. He refers that he was doing heavy lifting at the job and he felt pain on the area. This happened twice. The second time he went to the walk in clinic and he was found with the umbilical hernia. Since that they he has not had more pain episodes because he has not do heavy lifting. Patient Refers pain did not radiated to other part of the body. Pain improved spontaneously. Pain aggravated with heavy lifting. Patient denies episodes of abdominal distention, nausea or vomiting. Eating well and having regular bowel movements.    PROBLEM LIST:         Problem List  Date Reviewed: 12/23/2017         Noted   Anxiety and depression 04/06/2017   Overview    Last Assessment & Plan:  Currently improved and stable on medications.  Pt taking buspirone 7.5 bid and Wellbutrin SR 100 mg twice daily. Pt does report possible side effect of vivid dreams interrupting sleep. Denies SI/HI and has no plans to carry out if SI/HI arise.  Continues to have improved libido and relationships.  Plan: 1. Continue buspirone 7.5 mg bid.   2. CHANGE to bupropion XL 150 mg once daily for possible reduction of vivid dreams.  Cautioned pt about similar drug names. 3. Follow up 1 year or sooner if changes occur.         GENERAL REVIEW OF SYSTEMS:   General ROS: negative for - chills, fatigue, fever, weight gain or weight loss Allergy and Immunology ROS: negative for - hives  Hematological and Lymphatic ROS: negative for - bleeding problems or bruising, negative for palpable nodes Endocrine ROS: negative for - heat or cold intolerance, hair changes Respiratory ROS: negative for - cough, shortness of breath or  wheezing Cardiovascular ROS: no chest pain or palpitations GI ROS: negative for nausea, vomiting, abdominal pain, diarrhea, constipation Musculoskeletal ROS: negative for - joint swelling or muscle pain Neurological ROS: negative for - confusion, syncope Dermatological ROS: negative for pruritus and rash Psychiatric: negative for anxiety, depression, difficulty sleeping and memory loss  MEDICATIONS: CurrentMedications        Current Outpatient Medications  Medication Sig Dispense Refill  . albuterol (PROVENTIL HFA) 90 mcg/actuation inhaler Inhale into the lungs    . triamcinolone 0.025 % lotion APPLY TO AFFECTED AREA TWICE A DAY  2   No current facility-administered medications for this visit.       ALLERGIES: Sulfa (sulfonamide antibiotics)  PAST MEDICAL HISTORY:     Past Medical History:  Diagnosis Date  . Asthma without status asthmaticus, unspecified     PAST SURGICAL HISTORY: History reviewed. No pertinent surgical history.   FAMILY HISTORY:      Family History  Problem Relation Age of Onset  . Cancer Maternal Grandmother   . Emphysema Paternal Grandmother   . Coronary Artery Disease (Blocked arteries around heart) Paternal Grandfather      SOCIAL HISTORY: Social History          Socioeconomic History  . Marital status: Married    Spouse name: Not on file  . Number of children: Not on file  . Years of education: Not on file  . Highest education level:  Not on file  Occupational History  . Not on file  Social Needs  . Financial resource strain: Not on file  . Food insecurity:    Worry: Not on file    Inability: Not on file  . Transportation needs:    Medical: Not on file    Non-medical: Not on file  Tobacco Use  . Smoking status: Former Games developer  . Smokeless tobacco: Former Engineer, water and Sexual Activity  . Alcohol use: Never    Frequency: Never  . Drug use: Never  . Sexual activity: Defer  Other Topics Concern   . Not on file  Social History Narrative  . Not on file      PHYSICAL EXAM:    Vitals:   01/07/18 1100  BP: (!) 125/93  Pulse: 86  Temp: 36.7 C (98 F)   Body mass index is 30.87 kg/m. Weight: 92.1 kg (203 lb)   GENERAL: Alert, active, oriented x3  HEENT: Pupils equal reactive to light. Extraocular movements are intact. Sclera clear. Palpebral conjunctiva normal red color.Pharynx clear.  NECK: Supple with no palpable mass and no adenopathy.  LUNGS: Sound clear with no rales rhonchi or wheezes.  HEART: Regular rhythm S1 and S2 without murmur.  ABDOMEN: Soft and depressible, nontender with no palpable mass, no hepatomegaly. Umbilical hernia, not reducible, mild tender to palpation.   EXTREMITIES: Well-developed well-nourished symmetrical with no dependent edema.  NEUROLOGICAL: Awake alert oriented, facial expression symmetrical, moving all extremities.  REVIEW OF DATA: I have reviewed the following data today: No visits with results within 3 Month(s) from this visit.  Latest known visit with results is:  No results found for any previous visit.     ASSESSMENT: Mr. Gutridge is a 37 y.o. male presenting for consultation for umbilical hernia.    The patient presents with a symptomatic, incarcerated (most likely fat) umbilical hernia. Patient was oriented about the diagnosis of umbilical hernia and its implication. The patient was oriented about the treatment alternatives (observation vs surgical repair). Due to patient symptoms, repair is recommended. Patient oriented about the surgical procedure, the use of mesh and its risk of complications such as: infection, bleeding, injury to bowel, and chronic pain.  PLAN: 1. Umbilical hernia repair with mesh (30865) 2. CBC, CMP done 01/02/18 3. Avoid taking aspirin 5 days before surgery 4. Contact the clinic if has any concern or question.   Patient verbalized understanding, all questions were answered, and were  agreeable with the plan outlined above.    Carolan Shiver, MD  Electronically signed by Carolan Shiver, MD

## 2018-01-07 NOTE — H&P (Signed)
PATIENT PROFILE: Paul Cohen is a 37 y.o. male who presents to the Clinic for consultation at the request of Dr. Wallene Huharew for evaluation of umbilical hernia.  PCP:  Galen ManilaKennedy, Lauren Renee, NP  HISTORY OF PRESENT ILLNESS: Paul Cohen reports feeling pain on the peri umbilical area on may 16th. He refers that he was doing heavy lifting at the job and he felt pain on the area. This happened twice. The second time he went to the walk in clinic and he was found with the umbilical hernia. Since that they he has not had more pain episodes because he has not do heavy lifting. Patient Refers pain did not radiated to other part of the body. Pain improved spontaneously. Pain aggravated with heavy lifting. Patient denies episodes of abdominal distention, nausea or vomiting. Eating well and having regular bowel movements.    PROBLEM LIST:         Problem List  Date Reviewed: 12/23/2017         Noted   Anxiety and depression 04/06/2017   Overview    Last Assessment & Plan:  Currently improved and stable on medications.  Pt taking buspirone 7.5 bid and Wellbutrin SR 100 mg twice daily. Pt does report possible side effect of vivid dreams interrupting sleep. Denies SI/HI and has no plans to carry out if SI/HI arise.  Continues to have improved libido and relationships.  Plan: 1. Continue buspirone 7.5 mg bid.   2. CHANGE to bupropion XL 150 mg once daily for possible reduction of vivid dreams.  Cautioned pt about similar drug names. 3. Follow up 1 year or sooner if changes occur.         GENERAL REVIEW OF SYSTEMS:   General ROS: negative for - chills, fatigue, fever, weight gain or weight loss Allergy and Immunology ROS: negative for - hives  Hematological and Lymphatic ROS: negative for - bleeding problems or bruising, negative for palpable nodes Endocrine ROS: negative for - heat or cold intolerance, hair changes Respiratory ROS: negative for - cough, shortness of breath or  wheezing Cardiovascular ROS: no chest pain or palpitations GI ROS: negative for nausea, vomiting, abdominal pain, diarrhea, constipation Musculoskeletal ROS: negative for - joint swelling or muscle pain Neurological ROS: negative for - confusion, syncope Dermatological ROS: negative for pruritus and rash Psychiatric: negative for anxiety, depression, difficulty sleeping and memory loss  MEDICATIONS: CurrentMedications        Current Outpatient Medications  Medication Sig Dispense Refill  . albuterol (PROVENTIL HFA) 90 mcg/actuation inhaler Inhale into the lungs    . triamcinolone 0.025 % lotion APPLY TO AFFECTED AREA TWICE A DAY  2   No current facility-administered medications for this visit.       ALLERGIES: Sulfa (sulfonamide antibiotics)  PAST MEDICAL HISTORY:     Past Medical History:  Diagnosis Date  . Asthma without status asthmaticus, unspecified     PAST SURGICAL HISTORY: History reviewed. No pertinent surgical history.   FAMILY HISTORY:      Family History  Problem Relation Age of Onset  . Cancer Maternal Grandmother   . Emphysema Paternal Grandmother   . Coronary Artery Disease (Blocked arteries around heart) Paternal Grandfather      SOCIAL HISTORY: Social History          Socioeconomic History  . Marital status: Married    Spouse name: Not on file  . Number of children: Not on file  . Years of education: Not on file  . Highest education level:  Not on file  Occupational History  . Not on file  Social Needs  . Financial resource strain: Not on file  . Food insecurity:    Worry: Not on file    Inability: Not on file  . Transportation needs:    Medical: Not on file    Non-medical: Not on file  Tobacco Use  . Smoking status: Former Games developer  . Smokeless tobacco: Former Engineer, water and Sexual Activity  . Alcohol use: Never    Frequency: Never  . Drug use: Never  . Sexual activity: Defer  Other Topics Concern   . Not on file  Social History Narrative  . Not on file      PHYSICAL EXAM:    Vitals:   01/07/18 1100  BP: (!) 125/93  Pulse: 86  Temp: 36.7 C (98 F)   Body mass index is 30.87 kg/m. Weight: 92.1 kg (203 lb)   GENERAL: Alert, active, oriented x3  HEENT: Pupils equal reactive to light. Extraocular movements are intact. Sclera clear. Palpebral conjunctiva normal red color.Pharynx clear.  NECK: Supple with no palpable mass and no adenopathy.  LUNGS: Sound clear with no rales rhonchi or wheezes.  HEART: Regular rhythm S1 and S2 without murmur.  ABDOMEN: Soft and depressible, nontender with no palpable mass, no hepatomegaly. Umbilical hernia, not reducible, mild tender to palpation.   EXTREMITIES: Well-developed well-nourished symmetrical with no dependent edema.  NEUROLOGICAL: Awake alert oriented, facial expression symmetrical, moving all extremities.  REVIEW OF DATA: I have reviewed the following data today: No visits with results within 3 Month(s) from this visit.  Latest known visit with results is:  No results found for any previous visit.     ASSESSMENT: Paul Cohen is a 37 y.o. male presenting for consultation for umbilical hernia.    The patient presents with a symptomatic, incarcerated (most likely fat) umbilical hernia. Patient was oriented about the diagnosis of umbilical hernia and its implication. The patient was oriented about the treatment alternatives (observation vs surgical repair). Due to patient symptoms, repair is recommended. Patient oriented about the surgical procedure, the use of mesh and its risk of complications such as: infection, bleeding, injury to bowel, and chronic pain.  PLAN: 1. Umbilical hernia repair with mesh (30865) 2. CBC, CMP done 01/02/18 3. Avoid taking aspirin 5 days before surgery 4. Contact the clinic if has any concern or question.   Patient verbalized understanding, all questions were answered, and were  agreeable with the plan outlined above.    Carolan Shiver, MD  Electronically signed by Carolan Shiver, MD

## 2018-01-11 MED ORDER — CEFAZOLIN SODIUM-DEXTROSE 2-4 GM/100ML-% IV SOLN
2.0000 g | INTRAVENOUS | Status: AC
  Administered 2018-01-12: 2 g via INTRAVENOUS

## 2018-01-12 ENCOUNTER — Other Ambulatory Visit: Payer: Self-pay

## 2018-01-12 ENCOUNTER — Ambulatory Visit: Payer: No Typology Code available for payment source | Admitting: Anesthesiology

## 2018-01-12 ENCOUNTER — Encounter
Admission: RE | Disposition: A | Discharge status: Home / Self Care | Payer: Self-pay | Source: Ambulatory Visit | Attending: General Surgery

## 2018-01-12 ENCOUNTER — Encounter: Payer: Self-pay | Admitting: Certified Registered"

## 2018-01-12 ENCOUNTER — Ambulatory Visit
Admission: RE | Admit: 2018-01-12 | Discharge: 2018-01-12 | Disposition: A | Discharge status: Home / Self Care | Payer: No Typology Code available for payment source | Source: Ambulatory Visit | Attending: General Surgery | Admitting: General Surgery

## 2018-01-12 DIAGNOSIS — J45909 Unspecified asthma, uncomplicated: Secondary | ICD-10-CM | POA: Diagnosis not present

## 2018-01-12 DIAGNOSIS — K420 Umbilical hernia with obstruction, without gangrene: Secondary | ICD-10-CM | POA: Insufficient documentation

## 2018-01-12 DIAGNOSIS — Z79899 Other long term (current) drug therapy: Secondary | ICD-10-CM | POA: Insufficient documentation

## 2018-01-12 DIAGNOSIS — Z7951 Long term (current) use of inhaled steroids: Secondary | ICD-10-CM | POA: Insufficient documentation

## 2018-01-12 DIAGNOSIS — Z87891 Personal history of nicotine dependence: Secondary | ICD-10-CM | POA: Insufficient documentation

## 2018-01-12 DIAGNOSIS — F419 Anxiety disorder, unspecified: Secondary | ICD-10-CM | POA: Insufficient documentation

## 2018-01-12 DIAGNOSIS — F329 Major depressive disorder, single episode, unspecified: Secondary | ICD-10-CM | POA: Diagnosis not present

## 2018-01-12 HISTORY — PX: UMBILICAL HERNIA REPAIR: SHX196

## 2018-01-12 SURGERY — REPAIR, HERNIA, UMBILICAL, ADULT
Anesthesia: General | Wound class: Clean

## 2018-01-12 MED ORDER — HYDROCODONE-ACETAMINOPHEN 5-325 MG PO TABS
1.0000 | ORAL_TABLET | ORAL | Status: DC | PRN
  Administered 2018-01-12: 1 via ORAL

## 2018-01-12 MED ORDER — PROPOFOL 10 MG/ML IV BOLUS
INTRAVENOUS | Status: AC
  Filled 2018-01-12: qty 20

## 2018-01-12 MED ORDER — FENTANYL CITRATE (PF) 100 MCG/2ML IJ SOLN
25.0000 ug | INTRAMUSCULAR | Status: DC | PRN
  Administered 2018-01-12 (×2): 25 ug via INTRAVENOUS

## 2018-01-12 MED ORDER — SUGAMMADEX SODIUM 200 MG/2ML IV SOLN
INTRAVENOUS | Status: AC
  Filled 2018-01-12: qty 2

## 2018-01-12 MED ORDER — SUGAMMADEX SODIUM 200 MG/2ML IV SOLN
INTRAVENOUS | Status: DC | PRN
  Administered 2018-01-12: 200 mg via INTRAVENOUS

## 2018-01-12 MED ORDER — BUPIVACAINE-EPINEPHRINE (PF) 0.5% -1:200000 IJ SOLN
INTRAMUSCULAR | Status: DC | PRN
  Administered 2018-01-12: 10 mL via PERINEURAL

## 2018-01-12 MED ORDER — MIDAZOLAM HCL 2 MG/2ML IJ SOLN
INTRAMUSCULAR | Status: DC | PRN
  Administered 2018-01-12: 2 mg via INTRAVENOUS
  Administered 2018-01-12: 3 mg via INTRAVENOUS

## 2018-01-12 MED ORDER — HYDROCODONE-ACETAMINOPHEN 5-325 MG PO TABS: 1.0000 | ORAL_TABLET | ORAL | 0 refills | Status: AC | PRN

## 2018-01-12 MED ORDER — ROCURONIUM BROMIDE 100 MG/10ML IV SOLN
INTRAVENOUS | Status: DC | PRN
  Administered 2018-01-12: 50 mg via INTRAVENOUS

## 2018-01-12 MED ORDER — ACETAMINOPHEN 10 MG/ML IV SOLN
INTRAVENOUS | Status: AC
  Filled 2018-01-12: qty 100

## 2018-01-12 MED ORDER — ONDANSETRON HCL 4 MG/2ML IJ SOLN
INTRAMUSCULAR | Status: AC
  Filled 2018-01-12: qty 2

## 2018-01-12 MED ORDER — LACTATED RINGERS IV SOLN
INTRAVENOUS | Status: DC
  Administered 2018-01-12: 12:00:00 via INTRAVENOUS

## 2018-01-12 MED ORDER — HYDROMORPHONE HCL 1 MG/ML IJ SOLN
INTRAMUSCULAR | Status: DC | PRN
  Administered 2018-01-12: 2 mg via INTRAVENOUS

## 2018-01-12 MED ORDER — FENTANYL CITRATE (PF) 100 MCG/2ML IJ SOLN
INTRAMUSCULAR | Status: AC
  Administered 2018-01-12: 25 ug via INTRAVENOUS
  Filled 2018-01-12: qty 2

## 2018-01-12 MED ORDER — CEFAZOLIN SODIUM-DEXTROSE 2-4 GM/100ML-% IV SOLN
INTRAVENOUS | Status: AC
  Filled 2018-01-12: qty 100

## 2018-01-12 MED ORDER — BUPIVACAINE-EPINEPHRINE (PF) 0.5% -1:200000 IJ SOLN
INTRAMUSCULAR | Status: AC
  Filled 2018-01-12: qty 30

## 2018-01-12 MED ORDER — DEXAMETHASONE SODIUM PHOSPHATE 10 MG/ML IJ SOLN
INTRAMUSCULAR | Status: DC | PRN
  Administered 2018-01-12: 10 mg via INTRAVENOUS

## 2018-01-12 MED ORDER — KETAMINE HCL 10 MG/ML IJ SOLN
INTRAMUSCULAR | Status: DC | PRN
  Administered 2018-01-12 (×2): 25 mg via INTRAVENOUS

## 2018-01-12 MED ORDER — PROPOFOL 10 MG/ML IV BOLUS
INTRAVENOUS | Status: DC | PRN
  Administered 2018-01-12: 150 mg via INTRAVENOUS

## 2018-01-12 MED ORDER — FAMOTIDINE 20 MG PO TABS
ORAL_TABLET | ORAL | Status: AC
  Administered 2018-01-12: 20 mg via ORAL
  Filled 2018-01-12: qty 1

## 2018-01-12 MED ORDER — ROCURONIUM BROMIDE 50 MG/5ML IV SOLN
INTRAVENOUS | Status: AC
  Filled 2018-01-12: qty 1

## 2018-01-12 MED ORDER — ACETAMINOPHEN 10 MG/ML IV SOLN
INTRAVENOUS | Status: DC | PRN
  Administered 2018-01-12: 1000 mg via INTRAVENOUS

## 2018-01-12 MED ORDER — KETOROLAC TROMETHAMINE 30 MG/ML IJ SOLN
INTRAMUSCULAR | Status: DC | PRN
  Administered 2018-01-12: 30 mg via INTRAVENOUS

## 2018-01-12 MED ORDER — HYDROCODONE-ACETAMINOPHEN 5-325 MG PO TABS
ORAL_TABLET | ORAL | Status: AC
  Filled 2018-01-12: qty 1

## 2018-01-12 MED ORDER — GLYCOPYRROLATE 0.2 MG/ML IJ SOLN
INTRAMUSCULAR | Status: DC | PRN
  Administered 2018-01-12: 0.1 mg via INTRAVENOUS

## 2018-01-12 MED ORDER — ONDANSETRON HCL 4 MG/2ML IJ SOLN: 4.0000 mg | Freq: Once | INTRAMUSCULAR | Status: DC | PRN

## 2018-01-12 MED ORDER — MIDAZOLAM HCL 5 MG/5ML IJ SOLN
INTRAMUSCULAR | Status: AC
  Filled 2018-01-12: qty 5

## 2018-01-12 MED ORDER — ONDANSETRON HCL 4 MG/2ML IJ SOLN
INTRAMUSCULAR | Status: DC | PRN
  Administered 2018-01-12: 4 mg via INTRAVENOUS

## 2018-01-12 MED ORDER — FAMOTIDINE 20 MG PO TABS
20.0000 mg | ORAL_TABLET | Freq: Once | ORAL | Status: AC
  Administered 2018-01-12: 20 mg via ORAL

## 2018-01-12 MED ORDER — HYDROMORPHONE HCL 1 MG/ML IJ SOLN
INTRAMUSCULAR | Status: AC
  Filled 2018-01-12: qty 2

## 2018-01-12 MED ORDER — DEXAMETHASONE SODIUM PHOSPHATE 10 MG/ML IJ SOLN
INTRAMUSCULAR | Status: AC
  Filled 2018-01-12: qty 1

## 2018-01-12 MED ORDER — GLYCOPYRROLATE 0.2 MG/ML IJ SOLN
INTRAMUSCULAR | Status: AC
  Filled 2018-01-12: qty 1

## 2018-01-12 MED ORDER — KETOROLAC TROMETHAMINE 30 MG/ML IJ SOLN
INTRAMUSCULAR | Status: AC
  Filled 2018-01-12: qty 1

## 2018-01-12 MED ORDER — LIDOCAINE HCL (CARDIAC) PF 100 MG/5ML IV SOSY
PREFILLED_SYRINGE | INTRAVENOUS | Status: DC | PRN
  Administered 2018-01-12: 50 mg via INTRAVENOUS

## 2018-01-12 MED ORDER — LIDOCAINE HCL (PF) 2 % IJ SOLN
INTRAMUSCULAR | Status: AC
  Filled 2018-01-12: qty 10

## 2018-01-12 SURGICAL SUPPLY — 31 items
BLADE SURG 15 STRL LF DISP TIS (BLADE) ×1 IMPLANT
BLADE SURG 15 STRL SS (BLADE) ×2
CANISTER SUCT 1200ML W/VALVE (MISCELLANEOUS) ×3 IMPLANT
CHLORAPREP W/TINT 26ML (MISCELLANEOUS) ×3 IMPLANT
DERMABOND ADVANCED (GAUZE/BANDAGES/DRESSINGS) ×2
DERMABOND ADVANCED .7 DNX12 (GAUZE/BANDAGES/DRESSINGS) ×1 IMPLANT
DRAPE LAPAROTOMY 77X122 PED (DRAPES) ×3 IMPLANT
ELECT REM PT RETURN 9FT ADLT (ELECTROSURGICAL) ×3
ELECTRODE REM PT RTRN 9FT ADLT (ELECTROSURGICAL) ×1 IMPLANT
GLOVE BIO SURGEON STRL SZ 6.5 (GLOVE) ×2 IMPLANT
GLOVE BIO SURGEONS STRL SZ 6.5 (GLOVE) ×1
GOWN STRL REUS W/ TWL LRG LVL3 (GOWN DISPOSABLE) ×3 IMPLANT
GOWN STRL REUS W/TWL LRG LVL3 (GOWN DISPOSABLE) ×6
KIT TURNOVER KIT A (KITS) ×3 IMPLANT
LABEL OR SOLS (LABEL) ×3 IMPLANT
MESH VENTRALEX ST 1-7/10 CRC S (Mesh General) ×3 IMPLANT
NEEDLE HYPO 25X1 1.5 SAFETY (NEEDLE) ×3 IMPLANT
NS IRRIG 500ML POUR BTL (IV SOLUTION) ×3 IMPLANT
PACK BASIN MINOR ARMC (MISCELLANEOUS) ×3 IMPLANT
SUT MNCRL 4-0 (SUTURE) ×2
SUT MNCRL 4-0 27XMFL (SUTURE) ×1
SUT PDS AB 0 CT1 27 (SUTURE) ×6 IMPLANT
SUT PROLENE 0 CT 2 (SUTURE) ×6 IMPLANT
SUT VIC AB 0 CT1 36 (SUTURE) ×6 IMPLANT
SUT VIC AB 2-0 SH 27 (SUTURE) ×4
SUT VIC AB 2-0 SH 27XBRD (SUTURE) ×2 IMPLANT
SUT VIC AB 2-0 UR6 27 (SUTURE) ×3 IMPLANT
SUT VIC AB 3-0 SH 27 (SUTURE) ×2
SUT VIC AB 3-0 SH 27X BRD (SUTURE) ×1 IMPLANT
SUTURE MNCRL 4-0 27XMF (SUTURE) ×1 IMPLANT
SYR 10ML LL (SYRINGE) ×3 IMPLANT

## 2018-01-12 NOTE — Discharge Instructions (Addendum)
Eye Surgery Discharge Instructions  Expect mild scratchy sensation or mild soreness. DO NOT RUB YOUR EYE!  The day of surgery:  Minimal physical activity, but bed rest is not required  No reading, computer work, or close hand work  No bending, lifting, or straining.  May watch TV  For 24 hours:  No driving, legal decisions, or alcoholic beverages  Safety precautions  Eat anything you prefer: It is better to start with liquids, then soup then solid foods.  _____ Eye patch should be worn until postoperative exam tomorrow.  ____ Solar shield eyeglasses should be worn for comfort in the sunlight/patch while sleeping  Resume all regular medications including aspirin or Coumadin if these were discontinued prior to surgery. You may shower, bathe, shave, or wash your hair. Tylenol may be taken for mild discomfort.  Call your doctor if you experience significant pain, nausea, or vomiting, fever > 101 or other signs of infection. [phone] or [phone] Specific instructions:  Follow-up Information    Carolan Shiverintron-Diaz, Edgardo, MD Follow up in 2 week(s).   Specialty:  General Surgery Contact information: [address] [place] [id number] [phone]          AMBULATORY SURGERY  DISCHARGE INSTRUCTIONS   1) The drugs that you were given will stay in your system until tomorrow so for the next 24 hours you should not:  A) Drive an automobile B) Make any legal decisions C) Drink any alcoholic beverage   2) You may resume regular meals tomorrow.  Today it is better to start with liquids and gradually work up to solid foods.  You may eat anything you prefer, but it is better to start with liquids, then soup and crackers, and gradually work up to solid foods.   3) Please notify your doctor immediately if you have any unusual bleeding, trouble breathing, redness and pain at the surgery site, drainage, fever, or pain not relieved by medication. 4)   5) Your  post-operative visit with Dr.                                     is: Date:                        Time:    Please call to schedule your post-operative visit.  6) Additional Instructions:       Diet: Resume home heart healthy regular diet.   Activity: No heavy lifting >20 pounds (children, pets, laundry, garbage) or strenuous activity until follow-up, but light activity and walking are encouraged. Do not drive or drink alcohol if taking narcotic pain medications.  Wound care: May shower with soapy water and pat dry (do not rub incisions), but no baths or submerging incision underwater until follow-up. (no swimming)   Medications: Resume all home medications. For mild to moderate pain: acetaminophen (Tylenol) or ibuprofen (if no kidney disease). Combining Tylenol with alcohol can substantially increase your risk of causing liver disease. Narcotic pain medications, if prescribed, can be used for severe pain, though may cause nausea, constipation, and drowsiness. Do not combine Tylenol and Norco within a 6 hour period as Norco contains Tylenol. If you do not need the narcotic pain medication, you do not need to fill the prescription.  Call office [phone]) at any time if any questions, worsening pain, fevers/chills, bleeding, drainage from incision site, or other concerns.   AMBULATORY SURGERY  DISCHARGE INSTRUCTIONS   7) The drugs that you were given will stay in your system until tomorrow so for the next 24 hours you should not:  D) Drive an automobile E) Make any legal decisions F) Drink any alcoholic beverage   8) You may resume regular meals tomorrow.  Today it is better to start with liquids and gradually work up to solid foods.  You may eat anything you prefer, but it is better to start with liquids, then soup and crackers, and gradually work up to solid foods.   9) Please notify your doctor immediately if you have any unusual bleeding, trouble breathing, redness and  pain at the surgery site, drainage, fever, or pain not relieved by medication.    10) Additional Instructions:        Please contact your physician with any problems or Same Day Surgery at [phone], Monday through Friday 6 am to 4 pm, or  at [facility] number at [phone].

## 2018-01-12 NOTE — Anesthesia Preprocedure Evaluation (Signed)
Anesthesia Evaluation  Patient identified by MRN, date of birth, ID band Patient awake    Reviewed: Allergy & Precautions, NPO status , Patient's Chart, lab work & pertinent test results  History of Anesthesia Complications Negative for: history of anesthetic complications  Airway Mallampati: II       Dental   Pulmonary asthma , neg sleep apnea, neg COPD, former smoker,           Cardiovascular (-) hypertension(-) Past MI and (-) CHF (-) dysrhythmias (-) Valvular Problems/Murmurs     Neuro/Psych neg Seizures Anxiety Depression    GI/Hepatic Neg liver ROS, neg GERD  ,  Endo/Other  neg diabetes  Renal/GU negative Renal ROS     Musculoskeletal   Abdominal   Peds  Hematology   Anesthesia Other Findings   Reproductive/Obstetrics                             Anesthesia Physical Anesthesia Plan  ASA: II  Anesthesia Plan: General   Post-op Pain Management:    Induction: Intravenous  PONV Risk Score and Plan: 2 and Dexamethasone and Ondansetron  Airway Management Planned: Oral ETT  Additional Equipment:   Intra-op Plan:   Post-operative Plan:   Informed Consent: I have reviewed the patients History and Physical, chart, labs and discussed the procedure including the risks, benefits and alternatives for the proposed anesthesia with the patient or authorized representative who has indicated his/her understanding and acceptance.     Plan Discussed with:   Anesthesia Plan Comments:         Anesthesia Quick Evaluation

## 2018-01-12 NOTE — Anesthesia Post-op Follow-up Note (Signed)
Anesthesia QCDR form completed.        

## 2018-01-12 NOTE — Interval H&P Note (Signed)
History and Physical Interval Note:  01/12/2018 1:05 PM  Paul HammockMichael L Nakama  has presented today for surgery, with the diagnosis of UMBILICAL HERNIA  The various methods of treatment have been discussed with the patient and family. After consideration of risks, benefits and other options for treatment, the patient has consented to  Procedure(s): HERNIA REPAIR UMBILICAL ADULT (N/A) as a surgical intervention .  The patient's history has been reviewed, patient examined, no change in status, stable for surgery.  I have reviewed the patient's chart and labs.  Peri umbilical area marked in the pre procedure. Questions were answered to the patient's satisfaction.     Carolan ShiverEdgardo Cintron-Diaz

## 2018-01-12 NOTE — Anesthesia Procedure Notes (Signed)
Procedure Name: Intubation Date/Time: 01/12/2018 1:40 PM Performed by: Sherol DadeMacMang, Amada Hallisey H, CRNA Pre-anesthesia Checklist: Patient identified, Emergency Drugs available, Suction available, Patient being monitored and Timeout performed Patient Re-evaluated:Patient Re-evaluated prior to induction Oxygen Delivery Method: Circle system utilized Preoxygenation: Pre-oxygenation with 100% oxygen Induction Type: IV induction and Cricoid Pressure applied Ventilation: Mask ventilation without difficulty Laryngoscope Size: Miller and 2 Grade View: Grade II Tube type: Oral Tube size: 7.5 mm Number of attempts: 1 Airway Equipment and Method: Stylet Placement Confirmation: ETT inserted through vocal cords under direct vision,  positive ETCO2,  CO2 detector and breath sounds checked- equal and bilateral Secured at: 22 (@teeth ) cm Tube secured with: Tape Dental Injury: Teeth and Oropharynx as per pre-operative assessment

## 2018-01-12 NOTE — Anesthesia Postprocedure Evaluation (Signed)
Anesthesia Post Note  Patient: Paul Cohen  Procedure(s) Performed: HERNIA REPAIR UMBILICAL ADULT (N/A )  Patient location during evaluation: PACU Anesthesia Type: General Level of consciousness: awake and alert Pain management: pain level controlled Vital Signs Assessment: post-procedure vital signs reviewed and stable Respiratory status: spontaneous breathing and respiratory function stable Cardiovascular status: stable Anesthetic complications: no     Last Vitals:  Vitals:   01/12/18 1539 01/12/18 1549  BP: 121/82 136/83  Pulse: 91 84  Resp: 13 14  Temp: 37 C 36.7 C  SpO2: 99% 96%    Last Pain:  Vitals:   01/12/18 1549  TempSrc: Temporal  PainSc: 5                  KEPHART,WILLIAM K

## 2018-01-12 NOTE — Brief Op Note (Signed)
01/12/2018  3:04 PM  PATIENT:  Marolyn HammockMichael L Moccia  37 y.o. male  PRE-OPERATIVE DIAGNOSIS:  UMBILICAL HERNIA  POST-OPERATIVE DIAGNOSIS:  UMBILICAL HERNIA  PROCEDURE:  Procedure(s): HERNIA REPAIR UMBILICAL ADULT (N/A)  SURGEON:  Surgeon(s) and Role:    * Carolan Shiverintron-Diaz, Jazline Cumbee, MD - Primary  ANESTHESIA:   local and general  EBL:  15 mL

## 2018-01-12 NOTE — Transfer of Care (Signed)
Immediate Anesthesia Transfer of Care Note  Patient: Paul Cohen  Procedure(s) Performed: HERNIA REPAIR UMBILICAL ADULT (N/A )  Patient Location: PACU  Anesthesia Type:General  Level of Consciousness: awake, alert  and oriented  Airway & Oxygen Therapy: Patient Spontanous Breathing and Patient connected to face mask oxygen  Post-op Assessment: Report given to RN and Post -op Vital signs reviewed and stable  Post vital signs: Reviewed and stable  Last Vitals:  Vitals Value Taken Time  BP 140/88 01/12/2018  2:58 PM  Temp 37.3 C 01/12/2018  2:58 PM  Pulse 95 01/12/2018  3:00 PM  Resp 12 01/12/2018  3:00 PM  SpO2 100 % 01/12/2018  3:00 PM  Vitals shown include unvalidated device data.  Last Pain:  Vitals:   01/12/18 1205  TempSrc: Tympanic  PainSc:          Complications: No apparent anesthesia complications

## 2018-01-12 NOTE — Op Note (Signed)
Preoperative diagnosis: Umbilical hernia.  Postoperative diagnosis: Umbilical hernia.  Procedure: Mesh repair of umbilical hernia.                       Anesthesia: GETA  Surgeon: Dr. Hazle Quantintron Diaz  Wound Classification: Clean  Indications:Patient is a 37 y.o. male developed a umbilical hernia. This was symptomatic and incarcerated and repair was indicated.   Findings: 1. A 1.5 cm x 1.5 cm umbilical hernia. Unable to be reduced prior to surgery 2. A 4.3 cm Ventralex ST mesh used for repair 3. No hollow viscus organ identified during procedure 4. Tension free repair achieved 5. Adequate hemostasis  Description of procedure: The patient was brought to the operating room and general anesthesia was induced. A time-out was completed verifying correct patient, procedure, site, positioning, and implant(s) and/or special equipment prior to beginning this procedure. Antibiotics were administered prior to making the incision. The anterior abdominal wall was prepped and draped in the standard sterile fashion. A periumbilical incision was made below the umbilicus. The incision was deepened to the fascia. The umbilical stalk was then identified and dissected free. The defect was identified and the anterior fascia was dissected free of sac. The pre peritoneal space was developed. The fascia was carefully palpated and no additional defects were identified. A piece of 4.3 mesh was placed between the peritoneum layer and the fascia and sutured circumferentially to the anterior fascia with multiple PDS 0 interrumpted sutures. The anterior fascia was then closed with a running PDS 0. Dead space was approximated with 3-0 Vicryl and the skin was closed with subcuticular sutures of Monocryl 4-0.  The patient tolerated the procedure well and was brought to the postanesthesia care unit in stable condition.   Specimen: None  Complications: None  Estimated Blood Loss: 15 mL

## 2018-01-13 ENCOUNTER — Encounter: Payer: Self-pay | Admitting: General Surgery

## 2018-01-14 ENCOUNTER — Other Ambulatory Visit: Payer: Self-pay | Admitting: Nurse Practitioner

## 2018-01-14 DIAGNOSIS — F329 Major depressive disorder, single episode, unspecified: Secondary | ICD-10-CM

## 2018-01-14 DIAGNOSIS — F419 Anxiety disorder, unspecified: Principal | ICD-10-CM

## 2018-09-27 IMAGING — CR DG ABDOMEN ACUTE W/ 1V CHEST
5 series · 5 of 5 positions shown · non-contrast
Comparison: CT of the abdomen and pelvis performed 07/30/2005

CLINICAL DATA: Acute onset of right lower quadrant abdominal pain.

EXAM:
DG ABDOMEN ACUTE W/ 1V CHEST

[chest pa]
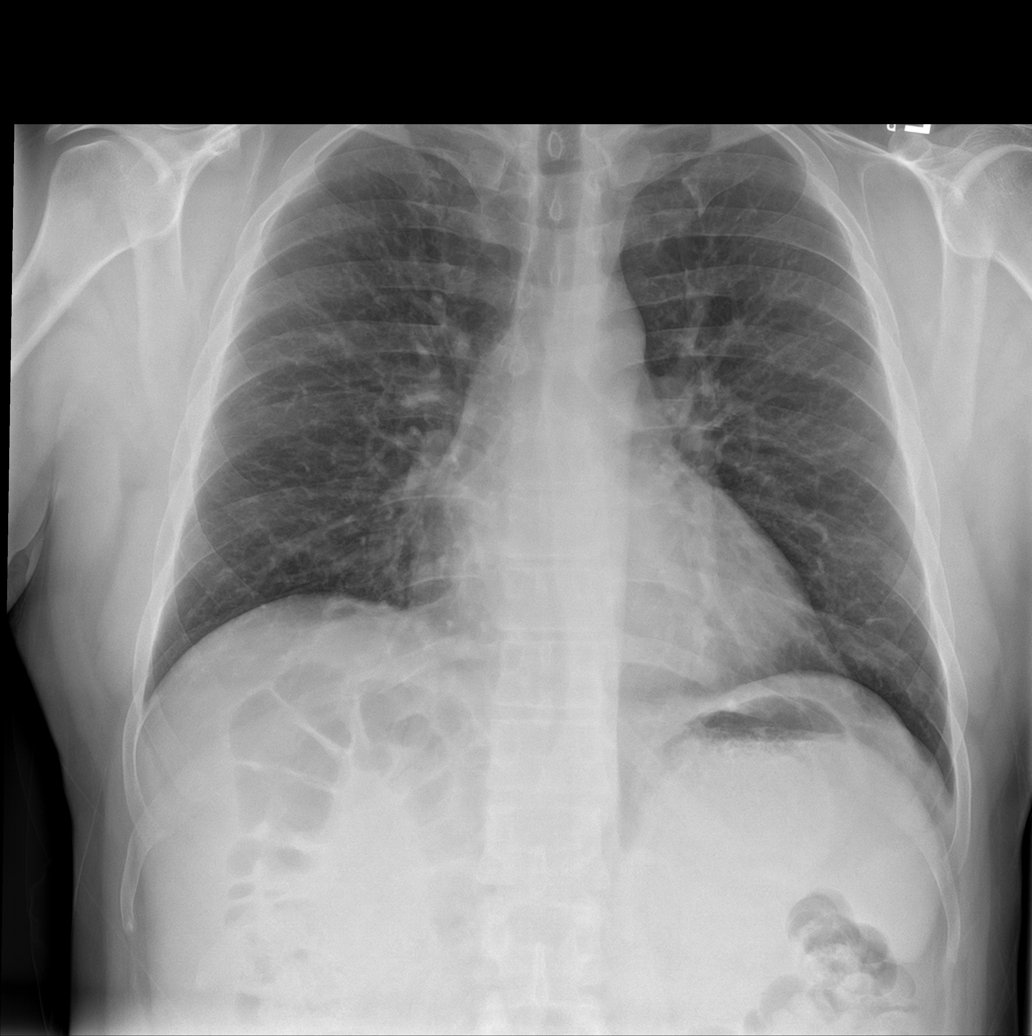

[abdomen erect (1 of 2)]
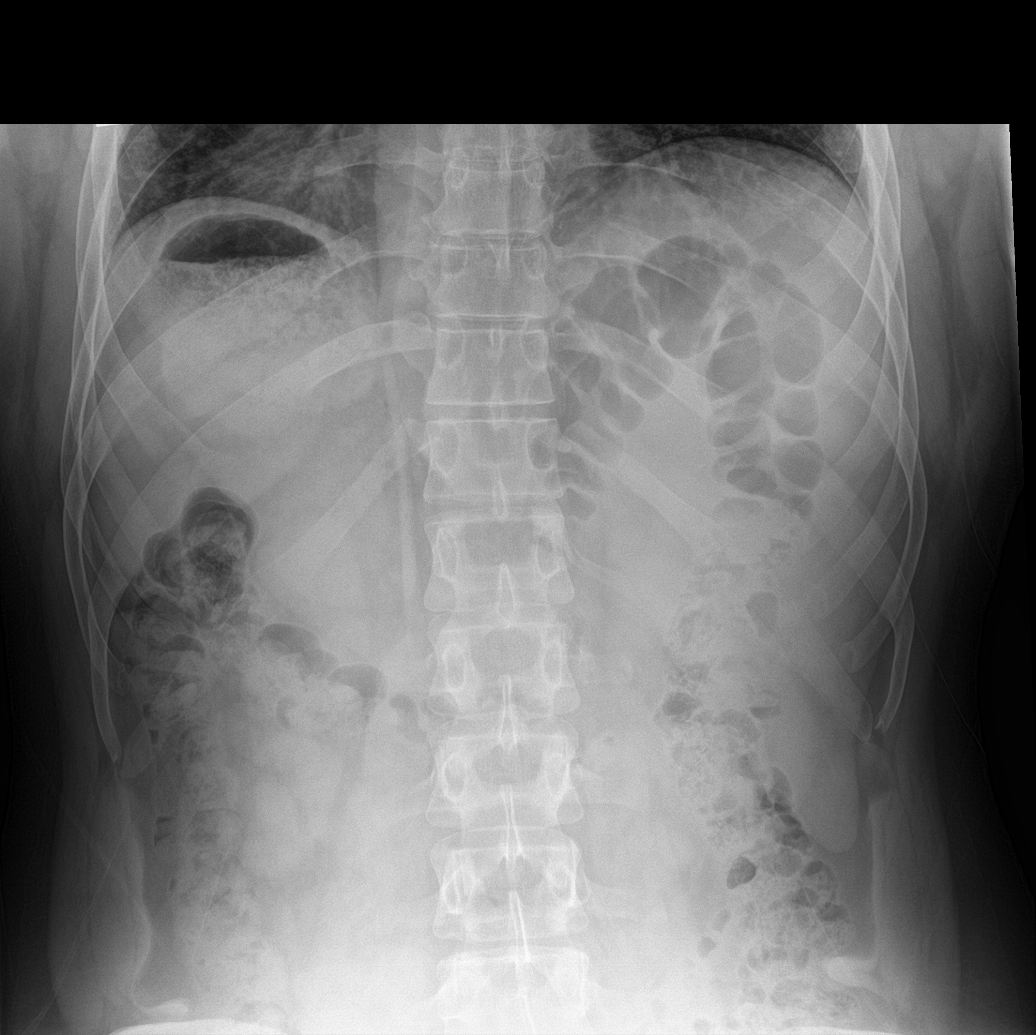

[abdomen erect (2 of 2)]
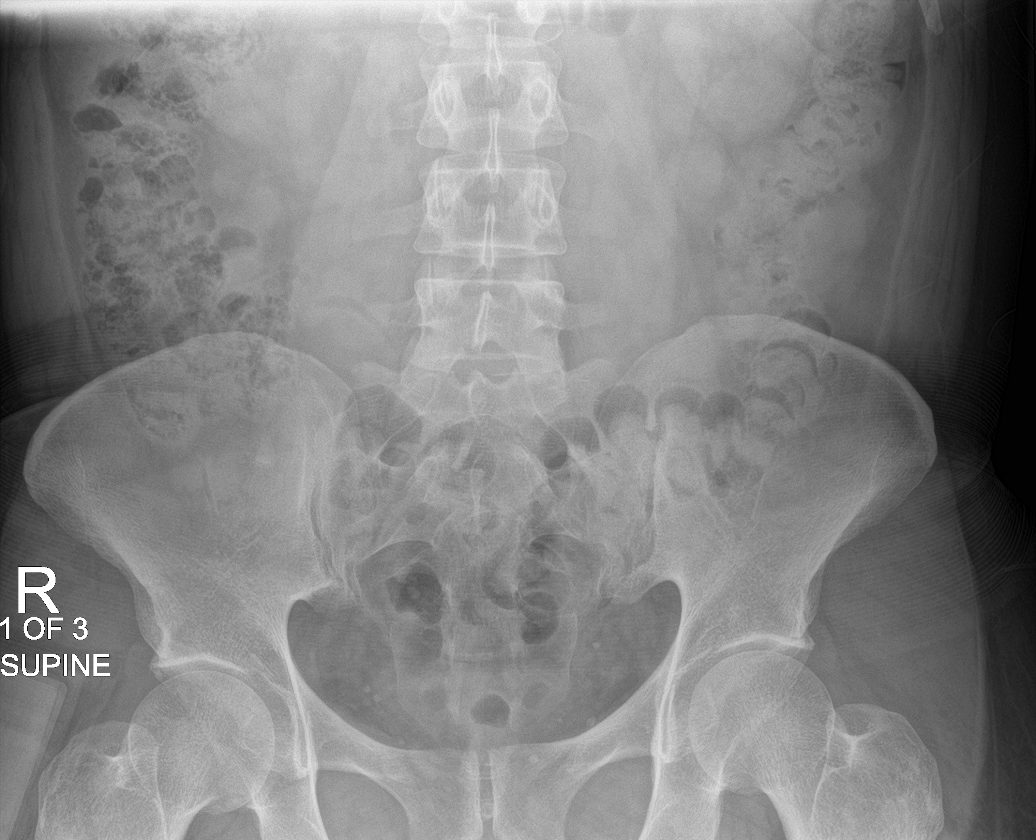

[abdomen supine (1 of 2)]
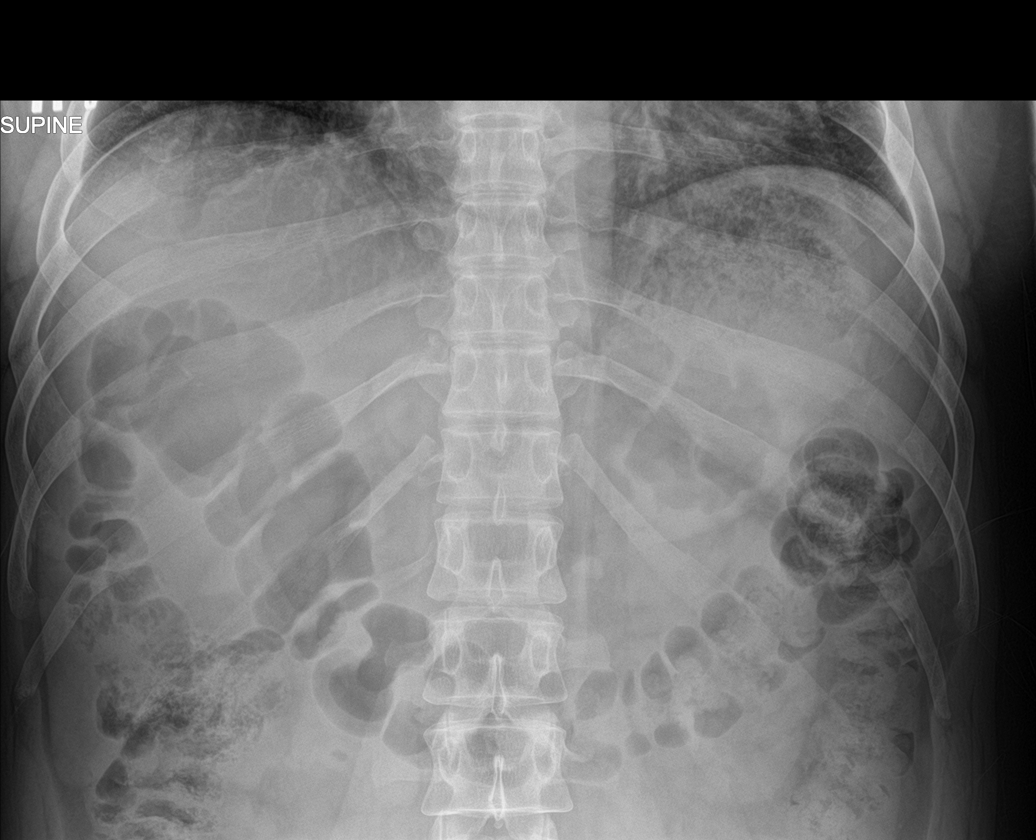

[abdomen supine (2 of 2)]
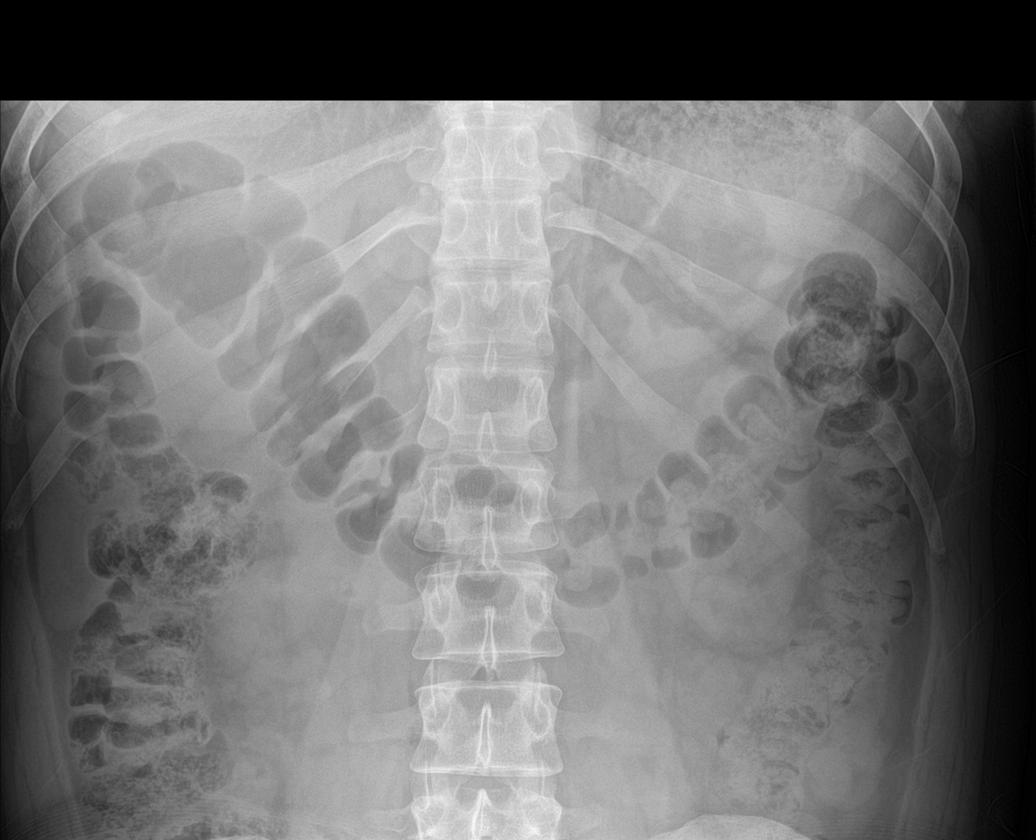

[5 of 5 positions shown; findings below may reference images not displayed]

FINDINGS: The lungs are well-aerated and clear. There is no evidence of focal
opacification, pleural effusion or pneumothorax. The
cardiomediastinal silhouette is within normal limits.

The visualized bowel gas pattern is unremarkable. Scattered stool
and air are seen within the colon; there is no evidence of small
bowel dilatation to suggest obstruction. No free intra-abdominal air
is identified on the provided upright view.

No acute osseous abnormalities are seen; the sacroiliac joints are
unremarkable in appearance.
IMPRESSION: 1. Unremarkable bowel gas pattern; no free intra-abdominal air seen.
Moderate amount of stool noted in the colon.
2. No acute cardiopulmonary process seen.

## 2019-03-09 ENCOUNTER — Ambulatory Visit (INDEPENDENT_AMBULATORY_CARE_PROVIDER_SITE_OTHER): Payer: Managed Care, Other (non HMO) | Admitting: Nurse Practitioner

## 2019-03-09 ENCOUNTER — Other Ambulatory Visit: Payer: Self-pay

## 2019-03-09 ENCOUNTER — Encounter: Payer: Self-pay | Admitting: Nurse Practitioner

## 2019-03-09 VITALS — BP 113/70 | HR 88 | Ht 67.0 in | Wt 199.4 lb

## 2019-03-09 DIAGNOSIS — J45909 Unspecified asthma, uncomplicated: Secondary | ICD-10-CM | POA: Insufficient documentation

## 2019-03-09 DIAGNOSIS — L309 Dermatitis, unspecified: Secondary | ICD-10-CM | POA: Diagnosis not present

## 2019-03-09 DIAGNOSIS — Z0000 Encounter for general adult medical examination without abnormal findings: Secondary | ICD-10-CM

## 2019-03-09 DIAGNOSIS — J4520 Mild intermittent asthma, uncomplicated: Secondary | ICD-10-CM | POA: Diagnosis not present

## 2019-03-09 DIAGNOSIS — Z23 Encounter for immunization: Secondary | ICD-10-CM | POA: Diagnosis not present

## 2019-03-09 MED ORDER — TRIAMCINOLONE ACETONIDE 0.1 % EX CREA: 1.0000 "application " | TOPICAL_CREAM | Freq: Two times a day (BID) | CUTANEOUS | 2 refills | Status: AC

## 2019-03-09 NOTE — Progress Notes (Signed)
Subjective:    Patient ID: Paul Cohen, male    DOB: 1980/10/12, 38 y.o.   MRN: [id number]  Paul Cohen is a 38 y.o. male presenting on 03/09/2019 for Annual Exam   HPI Annual Physical Exam Patient has been feeling well.  They have no acute concerns today. Sleeps 6 hours per night usually uninterrupted.  Gets home late and sometimes has trouble sleeping late enough.  Works 2nd shift.  HEALTH MAINTENANCE: Weight/BMI: decreasing steadily Physical activity: regular with job, but not planning outside of work Diet: regular Seatbelt: always Sunscreen: for prolonged exposure HIV: due Optometry: regular Dentistry: regular  VACCINES: Tetanus: Due - but he does not remember when he last had his vaccine   Asthma Uses rescue inhaler only 4-5times per year.  Needs refill due to expiration.  Eczema Triamcinolone works well when he continually uses it. No Cracking of skin when using steroid cream.   Past Medical History:  Diagnosis Date  . Allergy   . Asthma    childhood severe.  Uses 20-30 puffs per year now.  . Depression   . History of seasonal allergies    "Springtime"   Past Surgical History:  Procedure Laterality Date  . BALLOON DILATION N/A 01/01/2015   Procedure: BALLOON DILATION;  Surgeon: Charolett BumpersMartin K Johnson, MD;  [facility];  Service: Endoscopy;  Laterality: N/A;  . ESOPHAGOGASTRODUODENOSCOPY (EGD) WITH PROPOFOL N/A 01/01/2015   Procedure: ESOPHAGOGASTRODUODENOSCOPY (EGD) WITH PROPOFOL;  Surgeon: Charolett BumpersMartin K Johnson, MD;  Location: [facility];  Service: Endoscopy;  Laterality: N/A; Biopsy negative, esophageal stretch  . left wrist surgery    . UMBILICAL HERNIA REPAIR N/A 01/12/2018   Procedure: HERNIA REPAIR UMBILICAL ADULT;  Surgeon: Carolan Shiverintron-Diaz, Edgardo, MD;  Location: [facility];  Service: General;  Laterality: N/A;   Social History   Socioeconomic History  . Marital status: Married    Spouse name: Not on file  . Number of children: Not on file  .  Years of education: Not on file  . Highest education level: Not on file  Occupational History  . Not on file  Social Needs  . Financial resource strain: Not on file  . Food insecurity    Worry: Not on file    Inability: Not on file  . Transportation needs    Medical: Not on file    Non-medical: Not on file  Tobacco Use  . Smoking status: Former Smoker    Types: Cigarettes    Quit date: 07/28/2014    Years since quitting: 4.6  . Smokeless tobacco: Former NeurosurgeonUser    Types: Snuff  . Tobacco comment: off/ on 10 yrs, Stopped smokeless in January  Substance and Sexual Activity  . Alcohol use: Yes    Comment: weekends  . Drug use: No  . Sexual activity: Not on file  Lifestyle  . Physical activity    Days per week: Not on file    Minutes per session: Not on file  . Stress: Not on file  Relationships  . Social Musicianconnections    Talks on phone: Not on file    Gets together: Not on file    Attends religious service: Not on file    Active member of club or organization: Not on file    Attends meetings of clubs or organizations: Not on file    Relationship status: Not on file  . Intimate partner violence    Fear of current or ex partner: Not on file    Emotionally abused:  Not on file    Physically abused: Not on file    Forced sexual activity: Not on file  Other Topics Concern  . Not on file  Social History Narrative  . Not on file   Family History  Problem Relation Age of Onset  . Healthy Mother   . Colon polyps Mother 8       all negative  . Healthy Father   . Healthy Sister   . Bladder Cancer Maternal Grandmother   . Alcohol abuse Maternal Grandfather   . COPD Paternal Grandmother   . Heart attack Paternal Grandfather   . Healthy Sister   . Colon cancer Neg Hx    Current Outpatient Medications on File Prior to Visit  Medication Sig  . albuterol (PROVENTIL HFA;VENTOLIN HFA) 108 (90 BASE) MCG/ACT inhaler Inhale 1 puff into the lungs every 6 (six) hours as needed for  wheezing or shortness of breath.  . triamcinolone cream (KENALOG) 0.1 % Apply 1 application topically 2 (two) times daily.   No current facility-administered medications on file prior to visit.     Review of Systems  Constitutional: Negative for activity change, appetite change, fatigue and unexpected weight change.  HENT: Negative for congestion, hearing loss and trouble swallowing.   Eyes: Negative for visual disturbance.  Respiratory: Negative for choking, shortness of breath and wheezing.   Cardiovascular: Negative for chest pain and palpitations.  Gastrointestinal: Negative for abdominal pain, blood in stool, constipation and diarrhea.  Genitourinary: Negative for difficulty urinating, discharge, flank pain, genital sores, penile pain, penile swelling, scrotal swelling and testicular pain.  Musculoskeletal: Negative for arthralgias, back pain and myalgias.  Skin: Negative for color change, rash and wound.  Allergic/Immunologic: Negative for environmental allergies.  Neurological: Negative for dizziness, seizures, weakness and headaches.  Psychiatric/Behavioral: Negative for behavioral problems, decreased concentration, dysphoric mood, sleep disturbance and suicidal ideas. The patient is not nervous/anxious.    Per HPI unless specifically indicated above     Objective:    BP 113/70 (BP Location: Left Arm, Patient Position: Sitting, Cuff Size: Normal)   Pulse 88   Ht 5\' 7"  (1.702 m)   Wt 199 lb 6.4 oz (90.4 kg)   BMI 31.23 kg/m   Wt Readings from Last 3 Encounters:  03/09/19 199 lb 6.4 oz (90.4 kg)  01/12/18 204 lb (92.5 kg)  01/02/18 207 lb (93.9 kg)    Physical Exam Vitals signs and nursing note reviewed.  Constitutional:      General: He is not in acute distress.    Appearance: Normal appearance. He is well-developed. He is obese.  HENT:     Head: Normocephalic and atraumatic.     Right Ear: Tympanic membrane, ear canal and external ear normal.     Left Ear: Tympanic  membrane, ear canal and external ear normal.     Nose: Nose normal.     Mouth/Throat:     Mouth: Mucous membranes are moist.     Pharynx: Oropharynx is clear.  Eyes:     Conjunctiva/sclera: Conjunctivae normal.     Pupils: Pupils are equal, round, and reactive to light.  Neck:     Musculoskeletal: Normal range of motion and neck supple.     Thyroid: No thyromegaly.     Vascular: No JVD.     Trachea: No tracheal deviation.  Cardiovascular:     Rate and Rhythm: Normal rate and regular rhythm.     Heart sounds: Normal heart sounds. No murmur. No friction rub. No  gallop.   Pulmonary:     Effort: Pulmonary effort is normal. No respiratory distress.     Breath sounds: Normal breath sounds.  Abdominal:     General: Bowel sounds are normal. There is no distension.     Palpations: Abdomen is soft.     Tenderness: There is no abdominal tenderness.  Musculoskeletal: Normal range of motion.  Lymphadenopathy:     Cervical: No cervical adenopathy.  Skin:    General: Skin is warm and dry.     Capillary Refill: Capillary refill takes less than 2 seconds.  Neurological:     General: No focal deficit present.     Mental Status: He is alert and oriented to person, place, and time. Mental status is at baseline.     Cranial Nerves: No cranial nerve deficit.  Psychiatric:        Mood and Affect: Mood normal.        Behavior: Behavior normal.        Thought Content: Thought content normal.        Judgment: Judgment normal.    Results for orders placed or performed in visit on 03/09/19  HIV Antibody (routine testing w rflx)  Result Value Ref Range   HIV 1&2 Ab, 4th Generation NON-REACTIVE NON-REACTI  Lipid panel  Result Value Ref Range   Cholesterol 180 <200 mg/dL   HDL 39 (L) > OR = 40 mg/dL   Triglycerides 161161 (H) <150 mg/dL   LDL Cholesterol (Calc) 113 (H) mg/dL (calc)   Total CHOL/HDL Ratio 4.6 <5.0 (calc)   Non-HDL Cholesterol (Calc) 141 (H) <130 mg/dL (calc)  T4, free  Result Value  Ref Range   Free T4 1.2 0.8 - 1.8 ng/dL  CBC with Differential/Platelet  Result Value Ref Range   WBC 10.5 3.8 - 10.8 Thousand/uL   RBC 5.23 4.20 - 5.80 Million/uL   Hemoglobin 14.9 13.2 - 17.1 g/dL   HCT 09.644.1 04.538.5 - 40.950.0 %   MCV 84.3 80.0 - 100.0 fL   MCH 28.5 27.0 - 33.0 pg   MCHC 33.8 32.0 - 36.0 g/dL   RDW 81.113.3 91.411.0 - 78.215.0 %   Platelets 348 140 - 400 Thousand/uL   MPV 9.7 7.5 - 12.5 fL   Neutro Abs 7,245 1,500 - 7,800 cells/uL   Lymphs Abs 1,932 850 - 3,900 cells/uL   Absolute Monocytes 725 200 - 950 cells/uL   Eosinophils Absolute 494 15 - 500 cells/uL   Basophils Absolute 105 0 - 200 cells/uL   Neutrophils Relative % 69 %   Total Lymphocyte 18.4 %   Monocytes Relative 6.9 %   Eosinophils Relative 4.7 %   Basophils Relative 1.0 %  COMPLETE METABOLIC PANEL WITH GFR  Result Value Ref Range   Glucose, Bld 82 65 - 99 mg/dL   BUN 14 7 - 25 mg/dL   Creat 9.560.99 2.130.60 - 0.861.35 mg/dL   GFR, Est Non African American 95 > OR = 60 mL/min/1.173m2   GFR, Est African American 110 > OR = 60 mL/min/1.5773m2   BUN/Creatinine Ratio NOT APPLICABLE 6 - 22 (calc)   Sodium 140 135 - 146 mmol/L   Potassium 4.6 3.5 - 5.3 mmol/L   Chloride 104 98 - 110 mmol/L   CO2 28 20 - 32 mmol/L   Calcium 9.7 8.6 - 10.3 mg/dL   Total Protein 7.3 6.1 - 8.1 g/dL   Albumin 4.6 3.6 - 5.1 g/dL   Globulin 2.7 1.9 - 3.7 g/dL (calc)  AG Ratio 1.7 1.0 - 2.5 (calc)   Total Bilirubin 0.5 0.2 - 1.2 mg/dL   Alkaline phosphatase (APISO) 49 36 - 130 U/L   AST 16 10 - 40 U/L   ALT 23 9 - 46 U/L  Hemoglobin A1c  Result Value Ref Range   Hgb A1c MFr Bld 5.0 <5.7 % of total Hgb   Mean Plasma Glucose 97 (calc)   eAG (mmol/L) 5.4 (calc)      Assessment & Plan:   Problem List Items Addressed This Visit      Respiratory   Asthma     Musculoskeletal and Integument   Eczema of hand   Relevant Medications   triamcinolone cream (KENALOG) 0.1 %    Other Visit Diagnoses    Encounter for annual physical exam    -   Primary   Relevant Orders   HIV Antibody (routine testing w rflx) (Completed)   Tdap vaccine greater than or equal to 7yo IM (Completed)   Lipid panel (Completed)   T4, free (Completed)   CBC with Differential/Platelet (Completed)   COMPLETE METABOLIC PANEL WITH GFR (Completed)   Hemoglobin A1c (Completed)    Annual physical exam with no new findings.  Well adult with no acute concerns.  Plan: 1. Obtain health maintenance screenings as above according to age. - Increase physical activity to 30 minutes most days of the week.  - Eat healthy diet high in vegetables and fruits; low in refined carbohydrates. - Screening labs and tests as ordered 2. Return 1 year for annual physical.   Asthma stable without current exacerbation.  Needs albuterol refill due to going out of date.  Follow-up as needed and 1 year.  Eczema stable with triamcinolone cream.  No cracking of skin.  No desire to visit dermatology at this time. FOLLOW-UP 1 year.   Meds ordered this encounter  Medications  . triamcinolone cream (KENALOG) 0.1 %    Sig: Apply 1 application topically 2 (two) times daily.    Dispense:  80 g    Refill:  2    Order Specific Question:   Supervising Provider    Answer:   Smitty CordsKARAMALEGOS, ALEXANDER J [[id number]]     Follow up plan: Return in about 1 year (around 03/08/2020) for annual physical.  Wilhelmina McardleLauren Siddh Vandeventer, DNP, AGPCNP-BC Adult Gerontology Primary Care Nurse Practitioner [facility] 03/09/2019, 8:33 AM

## 2019-03-09 NOTE — Patient Instructions (Addendum)
Paul Cohen,   Thank you for coming in to clinic today.  1. Labs today.  Results will be released to your mychart account in 2 days after results come in.  Please schedule a follow-up appointment with Cassell Smiles, AGNP. Return in about 1 year (around 03/08/2020) for annual physical.  If you have any other questions or concerns, please feel free to call the clinic or send a message through [facility]. You may also schedule an earlier appointment if necessary.  You will receive a survey after today's visit either digitally by e-mail or paper by [employer]. Your experiences and feedback matter to [place].  Please respond so we know how we are doing as we provide care for you.   Cassell Smiles, DNP, AGNP-BC Adult Gerontology Nurse Practitioner [place]

## 2019-03-10 ENCOUNTER — Encounter: Payer: Self-pay | Admitting: Nurse Practitioner

## 2019-03-10 LAB — LIPID PANEL
Cholesterol: 180 mg/dL (ref <200)
HDL: 39 mg/dL — ABNORMAL LOW (ref >=40)
LDL Cholesterol (Calc): 113 mg/dL (calc) — ABNORMAL HIGH
Non-HDL Cholesterol (Calc): 141 mg/dL (calc) — ABNORMAL HIGH (ref <130)
Total CHOL/HDL Ratio: 4.6 (calc) (ref <5.0)
Triglycerides: 161 mg/dL — ABNORMAL HIGH (ref <150)

## 2019-03-10 LAB — COMPLETE METABOLIC PANEL WITH GFR
AG Ratio: 1.7 (calc) (ref 1.0–2.5)
ALT: 23 U/L (ref 9–46)
AST: 16 U/L (ref 10–40)
Albumin: 4.6 g/dL (ref 3.6–5.1)
Alkaline phosphatase (APISO): 49 U/L (ref 36–130)
BUN: 14 mg/dL (ref 7–25)
CO2: 28 mmol/L (ref 20–32)
Calcium: 9.7 mg/dL (ref 8.6–10.3)
Chloride: 104 mmol/L (ref 98–110)
Creat: 0.99 mg/dL (ref 0.60–1.35)
GFR, Est African American: 110 mL/min/{1.73_m2} (ref >=60)
GFR, Est Non African American: 95 mL/min/{1.73_m2} (ref >=60)
Globulin: 2.7 g/dL (calc) (ref 1.9–3.7)
Glucose, Bld: 82 mg/dL (ref 65–99)
Potassium: 4.6 mmol/L (ref 3.5–5.3)
Sodium: 140 mmol/L (ref 135–146)
Total Bilirubin: 0.5 mg/dL (ref 0.2–1.2)
Total Protein: 7.3 g/dL (ref 6.1–8.1)

## 2019-03-10 LAB — CBC WITH DIFFERENTIAL/PLATELET
Absolute Monocytes: 725 cells/uL (ref 200–950)
Basophils Absolute: 105 cells/uL (ref 0–200)
Basophils Relative: 1 %
Eosinophils Absolute: 494 cells/uL (ref 15–500)
Eosinophils Relative: 4.7 %
HCT: 44.1 % (ref 38.5–50.0)
Hemoglobin: 14.9 g/dL (ref 13.2–17.1)
Lymphs Abs: 1932 cells/uL (ref 850–3900)
MCH: 28.5 pg (ref 27.0–33.0)
MCHC: 33.8 g/dL (ref 32.0–36.0)
MCV: 84.3 fL (ref 80.0–100.0)
MPV: 9.7 fL (ref 7.5–12.5)
Monocytes Relative: 6.9 %
Neutro Abs: 7245 cells/uL (ref 1500–7800)
Neutrophils Relative %: 69 %
Platelets: 348 10*3/uL (ref 140–400)
RBC: 5.23 10*6/uL (ref 4.20–5.80)
RDW: 13.3 % (ref 11.0–15.0)
Total Lymphocyte: 18.4 %
WBC: 10.5 10*3/uL (ref 3.8–10.8)

## 2019-03-10 LAB — HIV ANTIBODY (ROUTINE TESTING W REFLEX): HIV 1&2 Ab, 4th Generation: NONREACTIVE

## 2019-03-10 LAB — HEMOGLOBIN A1C
Hgb A1c MFr Bld: 5 % of total Hgb (ref <5.7)
Mean Plasma Glucose: 97 (calc)
eAG (mmol/L): 5.4 (calc)

## 2019-03-10 LAB — T4, FREE: Free T4: 1.2 ng/dL (ref 0.8–1.8)

## 2019-03-10 MED ORDER — ALBUTEROL SULFATE HFA 108 (90 BASE) MCG/ACT IN AERS: 1.0000 | INHALATION_SPRAY | Freq: Four times a day (QID) | RESPIRATORY_TRACT | 1 refills | Status: AC | PRN

## 2019-05-24 ENCOUNTER — Encounter: Payer: Self-pay | Admitting: Nurse Practitioner

## 2019-05-24 DIAGNOSIS — J4530 Mild persistent asthma, uncomplicated: Secondary | ICD-10-CM

## 2019-05-25 MED ORDER — FLOVENT HFA 44 MCG/ACT IN AERO: 1.0000 | INHALATION_SPRAY | Freq: Two times a day (BID) | RESPIRATORY_TRACT | 12 refills | Status: AC
